# Patient Record
Sex: Female | Born: 1985 | Race: White | Hispanic: No | Marital: Single | Smoking: Current every day smoker
Health system: Southern US, Community
[De-identification: ages and names within clinical notes are randomized; demographics above are authoritative.]

## PROBLEM LIST (undated history)

## (undated) DIAGNOSIS — F4310 Post-traumatic stress disorder, unspecified: Secondary | ICD-10-CM

## (undated) DIAGNOSIS — I10 Essential (primary) hypertension: Secondary | ICD-10-CM

## (undated) HISTORY — PX: OTHER SURGICAL HISTORY: SHX169

---

## 2007-02-11 ENCOUNTER — Emergency Department: Payer: Self-pay | Admitting: Emergency Medicine

## 2007-03-01 ENCOUNTER — Emergency Department: Payer: Self-pay | Admitting: Emergency Medicine

## 2007-05-09 ENCOUNTER — Observation Stay: Payer: Self-pay | Admitting: Obstetrics and Gynecology

## 2008-01-20 ENCOUNTER — Emergency Department: Payer: Self-pay | Admitting: Emergency Medicine

## 2008-04-13 ENCOUNTER — Emergency Department: Payer: Self-pay

## 2008-05-04 ENCOUNTER — Emergency Department: Payer: Self-pay | Admitting: Emergency Medicine

## 2008-06-14 ENCOUNTER — Emergency Department: Payer: Self-pay | Admitting: Emergency Medicine

## 2008-07-22 ENCOUNTER — Emergency Department (HOSPITAL_BASED_OUTPATIENT_CLINIC_OR_DEPARTMENT_OTHER): Admission: EM | Admit: 2008-07-22 | Discharge: 2008-07-22 | Payer: Self-pay | Admitting: Emergency Medicine

## 2008-11-18 ENCOUNTER — Emergency Department: Payer: Self-pay | Admitting: Unknown Physician Specialty

## 2008-12-12 ENCOUNTER — Emergency Department: Payer: Self-pay | Admitting: Unknown Physician Specialty

## 2008-12-15 ENCOUNTER — Emergency Department: Payer: Self-pay | Admitting: Emergency Medicine

## 2009-04-03 ENCOUNTER — Emergency Department: Payer: Self-pay | Admitting: Emergency Medicine

## 2009-04-30 ENCOUNTER — Emergency Department: Payer: Self-pay | Admitting: Emergency Medicine

## 2009-05-07 ENCOUNTER — Emergency Department: Payer: Self-pay | Admitting: Emergency Medicine

## 2009-05-20 ENCOUNTER — Emergency Department: Payer: Self-pay | Admitting: Emergency Medicine

## 2010-04-06 ENCOUNTER — Emergency Department (HOSPITAL_COMMUNITY): Admission: EM | Admit: 2010-04-06 | Discharge: 2010-04-07 | Payer: Self-pay | Admitting: Emergency Medicine

## 2010-04-22 ENCOUNTER — Emergency Department: Payer: Self-pay | Admitting: Emergency Medicine

## 2010-04-28 ENCOUNTER — Emergency Department: Payer: Self-pay | Admitting: Emergency Medicine

## 2010-06-21 ENCOUNTER — Encounter: Payer: Self-pay | Admitting: Maternal & Fetal Medicine

## 2010-07-13 ENCOUNTER — Emergency Department: Payer: Self-pay | Admitting: Emergency Medicine

## 2010-07-24 ENCOUNTER — Encounter: Payer: Self-pay | Admitting: Obstetrics & Gynecology

## 2010-07-25 LAB — WET PREP, GENITAL
Clue Cells Wet Prep HPF POC: NONE SEEN
Trich, Wet Prep: NONE SEEN

## 2010-07-25 LAB — CBC
Platelets: 290 10*3/uL (ref 150–400)
RBC: 4.32 MIL/uL (ref 3.87–5.11)
WBC: 12.7 10*3/uL — ABNORMAL HIGH (ref 4.0–10.5)

## 2010-07-25 LAB — URINALYSIS, ROUTINE W REFLEX MICROSCOPIC
Hgb urine dipstick: NEGATIVE
Nitrite: NEGATIVE
Specific Gravity, Urine: 1.014 (ref 1.005–1.030)
Urobilinogen, UA: 0.2 mg/dL (ref 0.0–1.0)

## 2010-07-25 LAB — DIFFERENTIAL
Eosinophils Absolute: 0.3 10*3/uL (ref 0.0–0.7)
Lymphocytes Relative: 23 % (ref 12–46)
Lymphs Abs: 2.9 10*3/uL (ref 0.7–4.0)
Neutrophils Relative %: 70 % (ref 43–77)

## 2010-07-25 LAB — BASIC METABOLIC PANEL
Calcium: 8.9 mg/dL (ref 8.4–10.5)
Creatinine, Ser: 0.66 mg/dL (ref 0.4–1.2)
GFR calc Af Amer: >60 mL/min (ref >60)

## 2010-07-25 LAB — HCG, QUANTITATIVE, PREGNANCY: hCG, Beta Chain, Quant, S: 57601 m[IU]/mL — ABNORMAL HIGH (ref <5)

## 2010-07-25 LAB — POCT PREGNANCY, URINE: Preg Test, Ur: POSITIVE

## 2010-07-25 LAB — ABO/RH: ABO/RH(D): A NEG

## 2010-09-16 ENCOUNTER — Observation Stay: Payer: Self-pay | Admitting: Obstetrics and Gynecology

## 2010-09-25 ENCOUNTER — Observation Stay: Payer: Self-pay | Admitting: Obstetrics & Gynecology

## 2010-10-18 ENCOUNTER — Inpatient Hospital Stay: Payer: Self-pay | Admitting: Obstetrics and Gynecology

## 2010-11-25 ENCOUNTER — Emergency Department: Payer: Self-pay | Admitting: Emergency Medicine

## 2011-06-30 ENCOUNTER — Emergency Department: Payer: Self-pay | Admitting: *Deleted

## 2011-07-12 ENCOUNTER — Encounter (HOSPITAL_COMMUNITY): Payer: Self-pay

## 2011-07-12 ENCOUNTER — Emergency Department (HOSPITAL_COMMUNITY)
Admission: EM | Admit: 2011-07-12 | Discharge: 2011-07-12 | Disposition: A | Discharge status: Home / Self Care | Payer: Self-pay | Attending: Emergency Medicine | Admitting: Emergency Medicine

## 2011-07-12 ENCOUNTER — Emergency Department (HOSPITAL_COMMUNITY): Payer: Self-pay

## 2011-07-12 DIAGNOSIS — I10 Essential (primary) hypertension: Secondary | ICD-10-CM | POA: Insufficient documentation

## 2011-07-12 DIAGNOSIS — J45909 Unspecified asthma, uncomplicated: Secondary | ICD-10-CM | POA: Insufficient documentation

## 2011-07-12 DIAGNOSIS — M79672 Pain in left foot: Secondary | ICD-10-CM

## 2011-07-12 DIAGNOSIS — M79609 Pain in unspecified limb: Secondary | ICD-10-CM | POA: Insufficient documentation

## 2011-07-12 HISTORY — DX: Essential (primary) hypertension: I10

## 2011-07-12 MED ORDER — TRAMADOL HCL 50 MG PO TABS
50.0000 mg | ORAL_TABLET | Freq: Once | ORAL | Status: AC
  Administered 2011-07-12: 50 mg via ORAL
  Filled 2011-07-12: qty 1

## 2011-07-12 MED ORDER — TRAMADOL HCL 50 MG PO TABS: 50.0000 mg | ORAL_TABLET | Freq: Four times a day (QID) | ORAL | Status: AC | PRN

## 2011-07-12 NOTE — ED Notes (Signed)
Pt reports severe pain and swelling to her left foot.  Pt denies any injury.  nad noted

## 2011-07-12 NOTE — ED Notes (Signed)
Complain of pain and swelling in left foot. Denies injury

## 2011-07-12 NOTE — ED Provider Notes (Signed)
History     CSN: [id number]  Arrival date & time 07/12/11  4098   First MD Initiated Contact with Patient 07/12/11 [phone]      Chief Complaint  Patient presents with  . Foot Pain    (Consider location/radiation/quality/duration/timing/severity/associated sxs/prior treatment) HPI Comments: Pt denies injury.  Concerned that she may have gout or thyroid problem.  Patient is a 26 y.o. female presenting with lower extremity pain. The history is provided by the patient. No language interpreter was used.  Foot Pain This is a new problem. Episode onset: 3 days ago. The problem occurs constantly. The problem has been unchanged. The symptoms are aggravated by walking and standing. She has tried nothing for the symptoms.    Past Medical History  Diagnosis Date  . Hypertension   . Asthma     History reviewed. No pertinent past surgical history.  No family history on file.  History  Substance Use Topics  . Smoking status: Current Everyday Smoker  . Smokeless tobacco: Not on file  . Alcohol Use: No    OB History    Grav Para Term Preterm Abortions TAB SAB Ect Mult Living                  Review of Systems  Musculoskeletal:       Foot pain  All other systems reviewed and are negative.    Allergies  Penicillins  Home Medications   Current Outpatient Rx  Name Route Sig Dispense Refill  . BECLOMETHASONE DIPROPIONATE 80 MCG/ACT IN AERS Inhalation Inhale 1 puff into the lungs as needed. Shortness of Breath    . IBUPROFEN 200 MG PO TABS Oral Take 400 mg by mouth every 6 (six) hours as needed. Pain    . LISINOPRIL 2.5 MG PO TABS Oral Take 2.5 mg by mouth daily.    . ADULT MULTIVITAMIN W/MINERALS CH Oral Take 1 tablet by mouth daily.    . TRAMADOL HCL 50 MG PO TABS Oral Take 1 tablet (50 mg total) by mouth every 6 (six) hours as needed for pain. 15 tablet 0    BP 123/73  Pulse 108  Temp(Src) 97.8 F (36.6 C) (Oral)  Resp 18  Ht 5\' 3"  (1.6 m)  Wt 140 lb (63.504 kg)  BMI  24.80 kg/m2  SpO2 100%  LMP 07/05/2011  Physical Exam  Nursing note and vitals reviewed. Constitutional: She is oriented to person, place, and time. She appears well-developed and well-nourished. No distress.  HENT:  Head: Normocephalic and atraumatic.  Eyes: EOM are normal.  Neck: Normal range of motion.  Cardiovascular: Normal rate, regular rhythm and normal heart sounds.   Pulmonary/Chest: Effort normal and breath sounds normal.  Abdominal: Soft. She exhibits no distension. There is no tenderness.  Musculoskeletal: She exhibits tenderness.       Left foot: She exhibits decreased range of motion, tenderness and bony tenderness. She exhibits no swelling, no crepitus and no deformity.       Feet:  Neurological: She is alert and oriented to person, place, and time.  Skin: Skin is warm and dry.  Psychiatric: She has a normal mood and affect. Judgment normal.    ED Course  Procedures (including critical care time)  Labs Reviewed - No data to display Dg Ankle Complete Left  07/12/2011  *RADIOLOGY REPORT*  Clinical Data: Pain  LEFT ANKLE COMPLETE - 3+ VIEW  Comparison: None.  Findings: There is no evidence of fracture or dislocation.  Ankle mortise is intact.  Mild degenerative changes are present at the talonavicular joint.   No soft tissue abnormality.  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  Original Report Authenticated By: Brandon Melnick, M.D.   Dg Foot Complete Left  07/12/2011  *RADIOLOGY REPORT*  Clinical Data: Pain  LEFT FOOT - COMPLETE 3+ VIEW  Comparison: None.  Findings: There is no evidence of bone, joint, or soft tissue abnormality.  IMPRESSION: Negative left foot.  Original Report Authenticated By: Brandon Melnick, M.D.     1. Pain of left heel       MDM  Ace wrap, crutches, ice and elevation.  F/u with ortho prn.        Worthy Rancher, PA 07/12/11 1041  Worthy Rancher, [place] 07/15/11 [phone]

## 2011-07-12 NOTE — Discharge Instructions (Signed)
Use of Crutches You have been prescribed crutches to take weight off one of your lower extremities (legs/feet). When using crutches, make sure you are not putting pressure on your armpits. This could cause damage to the nerves in the axilla (your armpit area) that extend to your hands and arms. When fitted properly the crutches should be two to three finger breadths below your axilla (armpit). Your weight should be supported by your hands, and not by resting upon the crutches with your armpits. When walking, first step with the crutches, then swing the healthy leg through and slightly ahead. When going up stairs, first step up with the healthy leg and then follow with the crutches and injured leg up to the same step, and so forth. If there is a handrail, hold both crutches in one hand, place your other hand on the handrail, and while placing your weight on your arms, lift your good leg to the step, then bring the crutches and the injured leg up to that step. Repeat for each step. When going down stairs, first step with the injured leg and crutches, following down with the healthy leg to the same step. Be very careful, as going down stairs with crutches is very challenging. If you feel wobbly or nervous, sit down and inch yourself down the stairs on your butt. To get up from a chair, hold injured leg forward, grab armrest with one hand and the top of the crutches with the other hand. Using these supports, pull yourself up to a standing position. Reverse this procedure for sitting. See your caregiver for follow up as suggested. If you are discharged in an ace wrap and develop numbness, tingling, swelling, or increased pain, loosen the ace wrap and re-wrap looser. If these problems persist, see your caregiver as needed. If you have been instructed to use partial weight bearing, bear (apply) the amount of weight as suggested by your caregiver. Do not bear weight in an amount that causes pain on the area of  injury. Document Released: 04/27/2000 Document Revised: 01/10/2011 Document Reviewed: 07/05/2008 [facility] Patient Information 2012 [place], [place].Cryotherapy Cryotherapy means treatment with cold. Ice or gel packs can be used to reduce both pain and swelling. Ice is the most helpful within the first 24 to 48 hours after an injury or flareup from overusing a muscle or joint. Sprains, strains, spasms, burning pain, shooting pain, and aches can all be eased with ice. Ice can also be used when recovering from surgery. Ice is effective, has very few side effects, and is safe for most people to use. PRECAUTIONS  Ice is not a safe treatment option for people with:  Raynaud's phenomenon. This is a condition affecting small blood vessels in the extremities. Exposure to cold may cause your problems to return.   Cold hypersensitivity. There are many forms of cold hypersensitivity, including:   Cold urticaria. Red, itchy hives appear on the skin when the tissues begin to warm after being iced.   Cold erythema. This is a red, itchy rash caused by exposure to cold.   Cold hemoglobinuria. Red blood cells break down when the tissues begin to warm after being iced. The hemoglobin that carry oxygen are passed into the urine because they cannot combine with blood proteins fast enough.   Numbness or altered sensitivity in the area being iced.  If you have any of the following conditions, do not use ice until you have discussed cryotherapy with your caregiver:  Heart conditions, such as arrhythmia, angina,  or chronic heart disease.   High blood pressure.   Healing wounds or open skin in the area being iced.   Current infections.   Rheumatoid arthritis.   Poor circulation.   Diabetes.  Ice slows the blood flow in the region it is applied. This is beneficial when trying to stop inflamed tissues from spreading irritating chemicals to surrounding tissues. However, if you expose your skin to cold  temperatures for too long or without the proper protection, you can damage your skin or nerves. Watch for signs of skin damage due to cold. HOME CARE INSTRUCTIONS Follow these tips to use ice and cold packs safely.  Place a dry or damp towel between the ice and skin. A damp towel will cool the skin more quickly, so you may need to shorten the time that the ice is used.   For a more rapid response, add gentle compression to the ice.   Ice for no more than 10 to 20 minutes at a time. The bonier the area you are icing, the less time it will take to get the benefits of ice.   Check your skin after 5 minutes to make sure there are no signs of a poor response to cold or skin damage.   Rest 20 minutes or more in between uses.   Once your skin is numb, you can end your treatment. You can test numbness by very lightly touching your skin. The touch should be so light that you do not see the skin dimple from the pressure of your fingertip. When using ice, most people will feel these normal sensations in this order: cold, burning, aching, and numbness.   Do not use ice on someone who cannot communicate their responses to pain, such as small children or people with dementia.  HOW TO MAKE AN ICE PACK Ice packs are the most common way to use ice therapy. Other methods include ice massage, ice baths, and cryo-sprays. Muscle creams that cause a cold, tingly feeling do not offer the same benefits that ice offers and should not be used as a substitute unless recommended by your caregiver. To make an ice pack, do one of the following:  Place crushed ice or a bag of frozen vegetables in a sealable plastic bag. Squeeze out the excess air. Place this bag inside another plastic bag. Slide the bag into a pillowcase or place a damp towel between your skin and the bag.   Mix 3 parts water with 1 part rubbing alcohol. Freeze the mixture in a sealable plastic bag. When you remove the mixture from the freezer, it will be  slushy. Squeeze out the excess air. Place this bag inside another plastic bag. Slide the bag into a pillowcase or place a damp towel between your skin and the bag.  SEEK MEDICAL CARE IF:  You develop white spots on your skin. This may give the skin a blotchy (mottled) appearance.   Your skin turns blue or pale.   Your skin becomes waxy or hard.   Your swelling gets worse.  MAKE SURE YOU:   Understand these instructions.   Will watch your condition.   Will get help right away if you are not doing well or get worse.  Document Released: 12/25/2010 Document Reviewed: 12/21/2010 [facility] Patient Information 2012 [place], [place].   Wear the ace wrap for comfort.  Use the crutches as needed and bear weight as tolerated.  Apply ice 10-15 min several times daily and elevate frequently.  Take  ibuprofen up to 800 mg every 9 hrs with food.  Take the tramadol as directed.  Follow up with dr. Hilda Lias if the problems persists.  See you MD regarding your concern for thyroid or other medical problems.

## 2011-07-15 NOTE — ED Provider Notes (Signed)
Medical screening examination/treatment/procedure(s) were performed by non-physician practitioner and as supervising physician I was immediately available for consultation/collaboration.   Benny Lennert, MD 07/15/11 1110

## 2011-09-14 ENCOUNTER — Emergency Department: Payer: Self-pay | Admitting: Emergency Medicine

## 2011-09-14 LAB — CBC
HCT: 44.4 % (ref 35.0–47.0)
MCH: 28.5 pg (ref 26.0–34.0)
MCV: 87 fL (ref 80–100)
Platelet: 493 10*3/uL — ABNORMAL HIGH (ref 150–440)

## 2011-09-14 LAB — COMPREHENSIVE METABOLIC PANEL
Bilirubin,Total: 0.5 mg/dL (ref 0.2–1.0)
Co2: 22 mmol/L (ref 21–32)
Creatinine: 0.79 mg/dL (ref 0.60–1.30)
EGFR (Non-African Amer.): >60
Glucose: 84 mg/dL (ref 65–99)
Osmolality: 281 (ref 275–301)
SGPT (ALT): 52 U/L
Sodium: 139 mmol/L (ref 136–145)

## 2011-09-14 LAB — URINALYSIS, COMPLETE
Bacteria: NONE SEEN
Glucose,UR: NEGATIVE mg/dL (ref 0–75)
Hyaline Cast: 1
Leukocyte Esterase: NEGATIVE
Ph: 5 (ref 4.5–8.0)
Protein: 30
Squamous Epithelial: 5
WBC UR: <1 /HPF (ref 0–5)

## 2011-10-08 ENCOUNTER — Emergency Department (HOSPITAL_COMMUNITY): Payer: Self-pay

## 2011-10-08 ENCOUNTER — Encounter (HOSPITAL_COMMUNITY): Payer: Self-pay | Admitting: Emergency Medicine

## 2011-10-08 ENCOUNTER — Emergency Department (HOSPITAL_COMMUNITY)
Admission: EM | Admit: 2011-10-08 | Discharge: 2011-10-08 | Disposition: A | Discharge status: Home / Self Care | Payer: Self-pay | Attending: Emergency Medicine | Admitting: Emergency Medicine

## 2011-10-08 DIAGNOSIS — K0889 Other specified disorders of teeth and supporting structures: Secondary | ICD-10-CM

## 2011-10-08 DIAGNOSIS — J45909 Unspecified asthma, uncomplicated: Secondary | ICD-10-CM | POA: Insufficient documentation

## 2011-10-08 DIAGNOSIS — K089 Disorder of teeth and supporting structures, unspecified: Secondary | ICD-10-CM | POA: Insufficient documentation

## 2011-10-08 DIAGNOSIS — R0981 Nasal congestion: Secondary | ICD-10-CM

## 2011-10-08 DIAGNOSIS — I10 Essential (primary) hypertension: Secondary | ICD-10-CM | POA: Insufficient documentation

## 2011-10-08 DIAGNOSIS — J3489 Other specified disorders of nose and nasal sinuses: Secondary | ICD-10-CM | POA: Insufficient documentation

## 2011-10-08 MED ORDER — ALBUTEROL SULFATE HFA 108 (90 BASE) MCG/ACT IN AERS
2.0000 | INHALATION_SPRAY | RESPIRATORY_TRACT | Status: DC
  Administered 2011-10-08: 2 via RESPIRATORY_TRACT
  Filled 2011-10-08: qty 6.7

## 2011-10-08 MED ORDER — TRAMADOL HCL 50 MG PO TABS
50.0000 mg | ORAL_TABLET | Freq: Once | ORAL | Status: AC
  Administered 2011-10-08: 50 mg via ORAL
  Filled 2011-10-08: qty 1

## 2011-10-08 MED ORDER — TRAMADOL HCL 50 MG PO TABS: 50.0000 mg | ORAL_TABLET | Freq: Four times a day (QID) | ORAL | Status: AC | PRN

## 2011-10-08 NOTE — ED Provider Notes (Signed)
History     CSN: [id number]  Arrival date & time 10/08/11  1113   First MD Initiated Contact with Patient 10/08/11 1223      Chief Complaint  Patient presents with  . Cough    (Consider location/radiation/quality/duration/timing/severity/associated sxs/prior treatment) Patient is a 26 y.o. female presenting with cough. The history is provided by the patient.  Cough This is a new problem. The problem occurs constantly. The cough is productive of sputum. There has been no fever. Associated symptoms include sore throat, myalgias and wheezing. Pertinent negatives include no chest pain, no chills, no ear pain and no shortness of breath. She has tried nothing for the symptoms.    Past Medical History  Diagnosis Date  . Hypertension   . Asthma     History reviewed. No pertinent past surgical history.  History reviewed. No pertinent family history.  History  Substance Use Topics  . Smoking status: Current Everyday Smoker  . Smokeless tobacco: Not on file  . Alcohol Use: No    OB History    Grav Para Term Preterm Abortions TAB SAB Ect Mult Living                  Review of Systems  Constitutional: Negative for chills and activity change.       All ROS Neg except as noted in HPI  HENT: Positive for sore throat. Negative for ear pain, nosebleeds and neck pain.   Eyes: Negative for photophobia and discharge.  Respiratory: Positive for cough and wheezing. Negative for shortness of breath.   Cardiovascular: Negative for chest pain and palpitations.  Gastrointestinal: Negative for abdominal pain and blood in stool.  Genitourinary: Negative for dysuria, frequency and hematuria.  Musculoskeletal: Positive for myalgias. Negative for back pain and arthralgias.  Skin: Negative.   Neurological: Negative for dizziness, seizures and speech difficulty.  Psychiatric/Behavioral: Negative for hallucinations and confusion.    Allergies  Penicillins  Home Medications   Current  Outpatient Rx  Name Route Sig Dispense Refill  . BECLOMETHASONE DIPROPIONATE 80 MCG/ACT IN AERS Inhalation Inhale 1 puff into the lungs as needed. Shortness of Breath    . IBUPROFEN 200 MG PO TABS Oral Take 400 mg by mouth every 6 (six) hours as needed. Pain    . LISINOPRIL 2.5 MG PO TABS Oral Take 2.5 mg by mouth daily.    . ADULT MULTIVITAMIN W/MINERALS CH Oral Take 1 tablet by mouth daily.    . TRAMADOL HCL 50 MG PO TABS Oral Take 1 tablet (50 mg total) by mouth every 6 (six) hours as needed for pain. 20 tablet 0    BP 157/94  Pulse 102  Temp(Src) 97.2 F (36.2 C) (Oral)  Resp 14  SpO2 99%  LMP 10/06/2011  Physical Exam  Nursing note and vitals reviewed. Constitutional: She is oriented to person, place, and time. She appears well-developed and well-nourished.  Non-toxic appearance.  HENT:  Head: Normocephalic.  Right Ear: Tympanic membrane and external ear normal.  Left Ear: Tympanic membrane and external ear normal.       Nasal congestion. Right upper molar cavity.  Eyes: EOM and lids are normal. Pupils are equal, round, and reactive to light.  Neck: Normal range of motion. Neck supple. Carotid bruit is not present.  Cardiovascular: Normal rate, regular rhythm, normal heart sounds, intact distal pulses and normal pulses.   Pulmonary/Chest: Breath sounds normal. No respiratory distress.       Course breath sound present. No focal consolidation.  Abdominal: Soft. Bowel sounds are normal. There is no tenderness. There is no guarding.  Musculoskeletal: Normal range of motion.  Lymphadenopathy:       Head (right side): No submandibular adenopathy present.       Head (left side): No submandibular adenopathy present.    She has no cervical adenopathy.  Neurological: She is alert and oriented to person, place, and time. She has normal strength. No cranial nerve deficit or sensory deficit.  Skin: Skin is warm and dry.  Psychiatric: She has a normal mood and affect. Her speech is  normal.    ED Course  Procedures (including critical care time)  Labs Reviewed - No data to display Dg Chest 2 View  10/08/2011  *RADIOLOGY REPORT*  Clinical Data: Cough.  Congestion.  Tobacco use.  CHEST - 2 VIEW  Comparison: None.  Findings: Cardiac and mediastinal contours appear normal.  The lungs appear clear.  No pleural effusion is identified.  IMPRESSION:  No significant abnormality identified.  Original Report Authenticated By: Dellia Cloud, M.D.     1. Nasal congestion   2. Toothache       MDM  I have reviewed nursing notes, vital signs, and all appropriate lab and imaging results for this patient. Chest xray is negative. Pulse ox 99%, wnl.  Albuterol inhaler given. Rx for tramadol given to the  patient     Kathie Dike, [place] 10/08/11 2203

## 2011-10-08 NOTE — ED Notes (Addendum)
Pt states has been coughing x 1.5weeks. Sinus/sneezing/runny nose. Low grade fever per pt. States thinks it is due to mold in her house. Has not seen pcp and had old rx for erythromycin that she got filled and has been taking for 3 days. Nad. Sore in mouth x 2 days ago.

## 2011-10-08 NOTE — ED Notes (Signed)
Pt states has had respiratory infection x 10 days. Pt is taking an old (uncompleted antibiotic from Dec 2012) x 3 days. Pt denies change in URI symptoms of sore throat, earache, rhinitis,cough and low grade fever since restarting the partial RX of erythromycin. Pt is convinced after looking up symptoms on-line that she is having an allergic reaction to mold in her house. Pt reports living in this house for a long time. Pt also c/o lower rt dental pain.

## 2011-10-08 NOTE — Discharge Instructions (Signed)
Dental Pain A tooth ache may be caused by cavities (tooth decay). Cavities expose the nerve of the tooth to air and hot or cold temperatures. It may come from an infection or abscess (also called a boil or furuncle) around your tooth. It is also often caused by dental caries (tooth decay). This causes the pain you are having. DIAGNOSIS  Your caregiver can diagnose this problem by exam. TREATMENT   If caused by an infection, it may be treated with medications which kill germs (antibiotics) and pain medications as prescribed by your caregiver. Take medications as directed.   Only take over-the-counter or prescription medicines for pain, discomfort, or fever as directed by your caregiver.   Whether the tooth ache today is caused by infection or dental disease, you should see your dentist as soon as possible for further care.  SEEK MEDICAL CARE IF: The exam and treatment you received today has been provided on an emergency basis only. This is not a substitute for complete medical or dental care. If your problem worsens or new problems (symptoms) appear, and you are unable to meet with your dentist, call or return to this location. SEEK IMMEDIATE MEDICAL CARE IF:   You have a fever.   You develop redness and swelling of your face, jaw, or neck.   You are unable to open your mouth.   You have severe pain uncontrolled by pain medicine.  MAKE SURE YOU:   Understand these instructions.   Will watch your condition.   Will get help right away if you are not doing well or get worse.  Document Released: 04/30/2005 Document Revised: 04/19/2011 Document Reviewed: 12/17/2007 [facility] Patient Information 2012 [place], [place].Saline Nose Drops  To help clear a stuffy nose, put salt water (saline) nose drops in your infant's nose. This helps to loosen the secretions in the nose. Use a bulb syringe to clean the nose out:  Before feeding.   Before putting your infant down for naps.   No more than  once every 3 hours to avoid irritating your infant's nostrils.  HOME CARE  Buy nose drops at your local drug store. You can also make nose drops yourself. Mix 1 cup of water with  teaspoon of salt. Stir. Store this mixture at room temperature. Make a new batch daily.   To use the drops:   Put 1 or 2 drops in each side of infant's nose with a clean medicine dropper. Do not use this dropper for any other medicine.   Squeeze the air out of the suction bulb before inserting it into your infant's nose.   While still squeezing the bulb flat, place the tip of the bulb into a nostril. Let air come back into the bulb. The suction will pull snot out of the nose and into the bulb.   Repeat on other nostril.   Squeeze the bulb several times into a tissue and wash the bulb tip in soapy water. Store the bulb with the tip side down on paper towel.   Use the bulb syringe with only the saline drops to avoid irritating your infant's nostrils.  GET HELP RIGHT AWAY IF:  The snot changes to green or yellow.   The snot gets thicker.   Your infant is 3 months or younger with a rectal temperature of 100.4 F (38 C) or higher.   Your infant is older than 3 months with a rectal temperature of 102 F (38.9 C) or higher.   The stuffy nose lasts  10 days or longer.   There is trouble breathing or feeding.  MAKE SURE YOU:  Understand these instructions.   Will watch your infant's condition.   Will get help right away if your infant is not doing well or gets worse.  Document Released: 02/25/2009 Document Revised: 04/19/2011 Document Reviewed: 02/25/2009 [facility] Patient Information 2012 [place], [place].

## 2011-10-09 NOTE — ED Provider Notes (Signed)
Medical screening examination/treatment/procedure(s) were performed by non-physician practitioner and as supervising physician I was immediately available for consultation/collaboration.   Glynn Octave, MD 10/09/11 [phone]

## 2011-10-21 ENCOUNTER — Emergency Department: Payer: Self-pay | Admitting: Emergency Medicine

## 2011-10-30 ENCOUNTER — Encounter (HOSPITAL_COMMUNITY): Payer: Self-pay | Admitting: Emergency Medicine

## 2011-10-30 ENCOUNTER — Emergency Department (HOSPITAL_COMMUNITY)
Admission: EM | Admit: 2011-10-30 | Discharge: 2011-10-30 | Disposition: A | Discharge status: Home / Self Care | Payer: Self-pay | Attending: Emergency Medicine | Admitting: Emergency Medicine

## 2011-10-30 DIAGNOSIS — M79673 Pain in unspecified foot: Secondary | ICD-10-CM

## 2011-10-30 DIAGNOSIS — I10 Essential (primary) hypertension: Secondary | ICD-10-CM | POA: Insufficient documentation

## 2011-10-30 DIAGNOSIS — F17200 Nicotine dependence, unspecified, uncomplicated: Secondary | ICD-10-CM | POA: Insufficient documentation

## 2011-10-30 DIAGNOSIS — M79609 Pain in unspecified limb: Secondary | ICD-10-CM | POA: Insufficient documentation

## 2011-10-30 MED ORDER — TRAMADOL HCL 50 MG PO TABS: 50.0000 mg | ORAL_TABLET | Freq: Four times a day (QID) | ORAL | Status: DC | PRN

## 2011-10-30 NOTE — ED Provider Notes (Signed)
Medical screening examination/treatment/procedure(s) were performed by non-physician practitioner and as supervising physician I was immediately available for consultation/collaboration.  Shelda Jakes, MD 10/30/11 1958

## 2011-10-30 NOTE — ED Provider Notes (Signed)
History     CSN: [id number]  Arrival date & time 10/30/11  1842   First MD Initiated Contact with Patient 10/30/11 1923      Chief Complaint  Patient presents with  . Foot Pain    (Consider location/radiation/quality/duration/timing/severity/associated sxs/prior treatment) HPI Comments: Patient c/o persistent pain to her right foot for several days.  States that she fell in a swimming pool one week ago and was seen at another ED and diagnosed with a fracture to her foot.  She is unsure of which bone.  States that she has been walking and standing more on her foot this week and the pain has increased.  She denies swelling or numbness.  PAin improves with rest.    Patient is a 26 y.o. female presenting with lower extremity pain. The history is provided by the patient.  Foot Pain Associated symptoms include arthralgias and joint swelling. Pertinent negatives include no chills or fever.    Past Medical History  Diagnosis Date  . Hypertension   . Asthma     History reviewed. No pertinent past surgical history.  History reviewed. No pertinent family history.  History  Substance Use Topics  . Smoking status: Current Everyday Smoker  . Smokeless tobacco: Not on file  . Alcohol Use: Yes     ocassionally    OB History    Grav Para Term Preterm Abortions TAB SAB Ect Mult Living                  Review of Systems  Constitutional: Negative for fever and chills.  Genitourinary: Negative for dysuria and difficulty urinating.  Musculoskeletal: Positive for joint swelling and arthralgias. Negative for back pain and gait problem.  Skin: Negative for color change and wound.  All other systems reviewed and are negative.    Allergies  Penicillins  Home Medications   Current Outpatient Rx  Name Route Sig Dispense Refill  . BECLOMETHASONE DIPROPIONATE 80 MCG/ACT IN AERS Inhalation Inhale 1 puff into the lungs as needed. Shortness of Breath    . IBUPROFEN 200 MG PO TABS Oral  Take 400 mg by mouth every 6 (six) hours as needed. Pain    . LISINOPRIL 2.5 MG PO TABS Oral Take 2.5 mg by mouth daily.    . ADULT MULTIVITAMIN W/MINERALS CH Oral Take 1 tablet by mouth daily.    . TRAMADOL HCL 50 MG PO TABS Oral Take 1 tablet (50 mg total) by mouth every 6 (six) hours as needed for pain. 20 tablet 0    BP 159/94  Pulse 89  Temp 97.9 F (36.6 C) (Oral)  Resp 20  Ht 5\' 3"  (1.6 m)  Wt 150 lb (68.04 kg)  BMI 26.57 kg/m2  SpO2 100%  LMP 10/23/2011  Physical Exam  Nursing note and vitals reviewed. Constitutional: She is oriented to person, place, and time. She appears well-developed and well-nourished. No distress.  HENT:  Head: Normocephalic and atraumatic.  Cardiovascular: Normal rate, regular rhythm, normal heart sounds and intact distal pulses.   Pulmonary/Chest: Effort normal and breath sounds normal.  Musculoskeletal: She exhibits tenderness.       Right foot: She exhibits tenderness and bony tenderness. She exhibits normal range of motion, no swelling, normal capillary refill, no crepitus, no deformity and no laceration.       Feet:       ttp of the dorsal right foot. ROM is preserved.  DP pulse is brisk, sensation intact.  No erythema, abrasion, bruising or  deformity.    Neurological: She is alert and oriented to person, place, and time. She exhibits normal muscle tone. Coordination normal.  Skin: Skin is warm and dry.    ED Course  Procedures (including critical care time)  Labs Reviewed - No data to display   1. Foot pain       MDM     Pt is wearing an ACE wrap and post op shoe.  Has crutches.  NV intact.    Patient advised to follow-up with orthopedics.  Referral given  Patient / Family / Caregiver understand and agree with initial ED impression and plan with expectations set for ED visit. Pt stable in ED with no significant deterioration in condition. Pt feels improved after observation and/or treatment in ED.    Prescribed: Tramadol  #20   [place] Kirchner [address]. [place], [place] 10/30/11 1956

## 2011-10-30 NOTE — ED Notes (Signed)
Patient states "I broke my right foot over 1 week ago and I stayed off of it until this past weekend when I did a lot of walking and now my foot is hurting really bad."

## 2011-11-09 ENCOUNTER — Emergency Department (HOSPITAL_COMMUNITY)
Admission: EM | Admit: 2011-11-09 | Discharge: 2011-11-09 | Disposition: A | Discharge status: Home / Self Care | Payer: Self-pay | Attending: Emergency Medicine | Admitting: Emergency Medicine

## 2011-11-09 ENCOUNTER — Encounter (HOSPITAL_COMMUNITY): Payer: Self-pay | Admitting: *Deleted

## 2011-11-09 DIAGNOSIS — Z79899 Other long term (current) drug therapy: Secondary | ICD-10-CM | POA: Insufficient documentation

## 2011-11-09 DIAGNOSIS — F17200 Nicotine dependence, unspecified, uncomplicated: Secondary | ICD-10-CM | POA: Insufficient documentation

## 2011-11-09 DIAGNOSIS — K089 Disorder of teeth and supporting structures, unspecified: Secondary | ICD-10-CM | POA: Insufficient documentation

## 2011-11-09 DIAGNOSIS — K0889 Other specified disorders of teeth and supporting structures: Secondary | ICD-10-CM

## 2011-11-09 DIAGNOSIS — I10 Essential (primary) hypertension: Secondary | ICD-10-CM | POA: Insufficient documentation

## 2011-11-09 MED ORDER — CLINDAMYCIN HCL 150 MG PO CAPS: 300.0000 mg | ORAL_CAPSULE | Freq: Three times a day (TID) | ORAL | Status: AC

## 2011-11-09 MED ORDER — TRAMADOL HCL 50 MG PO TABS: 50.0000 mg | ORAL_TABLET | Freq: Four times a day (QID) | ORAL | Status: AC | PRN

## 2011-11-09 NOTE — Discharge Instructions (Signed)
Dental Pain  A tooth ache may be caused by cavities (tooth decay). Cavities expose the nerve of the tooth to air and hot or cold temperatures. It may come from an infection or abscess (also called a boil or furuncle) around your tooth. It is also often caused by dental caries (tooth decay). This causes the pain you are having.  DIAGNOSIS   Your caregiver can diagnose this problem by exam.  TREATMENT   · If caused by an infection, it may be treated with medications which kill germs (antibiotics) and pain medications as prescribed by your caregiver. Take medications as directed.  · Only take over-the-counter or prescription medicines for pain, discomfort, or fever as directed by your caregiver.  · Whether the tooth ache today is caused by infection or dental disease, you should see your dentist as soon as possible for further care.  SEEK MEDICAL CARE IF:  The exam and treatment you received today has been provided on an emergency basis only. This is not a substitute for complete medical or dental care. If your problem worsens or new problems (symptoms) appear, and you are unable to meet with your dentist, call or return to this location.  SEEK IMMEDIATE MEDICAL CARE IF:   · You have a fever.  · You develop redness and swelling of your face, jaw, or neck.  · You are unable to open your mouth.  · You have severe pain uncontrolled by pain medicine.  MAKE SURE YOU:   · Understand these instructions.  · Will watch your condition.  · Will get help right away if you are not doing well or get worse.  Document Released: 04/30/2005 Document Revised: 04/19/2011 Document Reviewed: 12/17/2007  [employer]® Patient Information ©2012 [employer].

## 2011-11-09 NOTE — ED Provider Notes (Signed)
History     CSN: [id number]  Arrival date & time 11/09/11  1826   First MD Initiated Contact with Patient 11/09/11 1843      Chief Complaint  Patient presents with  . Dental Pain    (Consider location/radiation/quality/duration/timing/severity/associated sxs/prior treatment) HPI Comments: Tooth broke while eating a carrot 5 days ago.  It started hurting this AM.  Patient is a 26 y.o. female presenting with tooth pain. The history is provided by the patient. No language interpreter was used.  Dental PainThe primary symptoms include mouth pain. Primary symptoms do not include fever. The symptoms began 6 to 12 hours ago. The symptoms are unchanged.    Past Medical History  Diagnosis Date  . Hypertension   . Asthma     History reviewed. No pertinent past surgical history.  History reviewed. No pertinent family history.  History  Substance Use Topics  . Smoking status: Current Everyday Smoker -- 0.5 packs/day  . Smokeless tobacco: Not on file  . Alcohol Use: Yes     ocassionally    OB History    Grav Para Term Preterm Abortions TAB SAB Ect Mult Living                  Review of Systems  Constitutional: Negative for fever and chills.  HENT: Positive for dental problem.   All other systems reviewed and are negative.    Allergies  Penicillins  Home Medications   Current Outpatient Rx  Name Route Sig Dispense Refill  . BECLOMETHASONE DIPROPIONATE 80 MCG/ACT IN AERS Inhalation Inhale 1 puff into the lungs as needed. Shortness of Breath    . IBUPROFEN 200 MG PO TABS Oral Take 400 mg by mouth every 6 (six) hours as needed. Pain    . LISINOPRIL 2.5 MG PO TABS Oral Take 2.5 mg by mouth daily.    . ADULT MULTIVITAMIN W/MINERALS CH Oral Take 1 tablet by mouth daily.    Marland Kitchen CLINDAMYCIN HCL 150 MG PO CAPS Oral Take 2 capsules (300 mg total) by mouth 3 (three) times daily. 60 capsule 0  . TRAMADOL HCL 50 MG PO TABS Oral Take 1 tablet (50 mg total) by mouth every 6 (six)  hours as needed for pain. 30 tablet 0    BP 150/97  Pulse 91  Temp 99.7 F (37.6 C) (Oral)  Resp 15  Ht 5\' 3"  (1.6 m)  Wt 150 lb (68.04 kg)  BMI 26.57 kg/m2  SpO2 98%  LMP 10/23/2011  Physical Exam  Nursing note and vitals reviewed. Constitutional: She is oriented to person, place, and time. She appears well-developed and well-nourished. No distress.  HENT:  Head: Normocephalic and atraumatic.  Mouth/Throat:    Eyes: EOM are normal.  Neck: Normal range of motion.  Cardiovascular: Normal rate, regular rhythm and normal heart sounds.   Pulmonary/Chest: Effort normal and breath sounds normal.  Abdominal: Soft. She exhibits no distension. There is no tenderness.  Musculoskeletal: Normal range of motion.  Neurological: She is alert and oriented to person, place, and time.  Skin: Skin is warm and dry.  Psychiatric: She has a normal mood and affect. Judgment normal.    ED Course  Procedures (including critical care time)  Labs Reviewed - No data to display No results found.   1. Pain, dental       MDM  Clindamycin 300 mg, TID, 30 rx ultram, 30 OTC ibuprofen 800 mg TID        Evalina Field, PA 11/10/11  0024  Evalina Field, PA 11/10/11 0025

## 2011-11-09 NOTE — ED Notes (Signed)
Patient broke left lateral lower tooth Monday evening.  Has called her dentist and has an appt. On July 10.  This AM while eating granola, tooth began to hurt much worse. As day progressed, pain worsened. Called her dentist who told her to come to ED.

## 2011-11-09 NOTE — ED Notes (Signed)
Patient with no complaints at this time. Respirations even and unlabored. Skin warm/dry. Discharge instructions reviewed with patient at this time. Patient given opportunity to voice concerns/ask questions. Patient discharged at this time and left Emergency Department with steady gait.   

## 2011-11-10 NOTE — ED Provider Notes (Signed)
Medical screening examination/treatment/procedure(s) were performed by non-physician practitioner and as supervising physician I was immediately available for consultation/collaboration.   Benny Lennert, MD 11/10/11 1452

## 2011-12-02 ENCOUNTER — Emergency Department (HOSPITAL_COMMUNITY): Payer: Self-pay

## 2011-12-02 ENCOUNTER — Emergency Department (HOSPITAL_COMMUNITY)
Admission: EM | Admit: 2011-12-02 | Discharge: 2011-12-02 | Disposition: A | Discharge status: Home / Self Care | Payer: Self-pay | Attending: Emergency Medicine | Admitting: Emergency Medicine

## 2011-12-02 ENCOUNTER — Encounter (HOSPITAL_COMMUNITY): Payer: Self-pay | Admitting: Emergency Medicine

## 2011-12-02 DIAGNOSIS — F17200 Nicotine dependence, unspecified, uncomplicated: Secondary | ICD-10-CM | POA: Insufficient documentation

## 2011-12-02 DIAGNOSIS — X58XXXA Exposure to other specified factors, initial encounter: Secondary | ICD-10-CM | POA: Insufficient documentation

## 2011-12-02 DIAGNOSIS — S93601A Unspecified sprain of right foot, initial encounter: Secondary | ICD-10-CM

## 2011-12-02 DIAGNOSIS — I10 Essential (primary) hypertension: Secondary | ICD-10-CM | POA: Insufficient documentation

## 2011-12-02 DIAGNOSIS — S93609A Unspecified sprain of unspecified foot, initial encounter: Secondary | ICD-10-CM | POA: Insufficient documentation

## 2011-12-02 MED ORDER — TRAMADOL HCL 50 MG PO TABS: 50.0000 mg | ORAL_TABLET | Freq: Four times a day (QID) | ORAL | Status: AC | PRN

## 2011-12-02 MED ORDER — IBUPROFEN 800 MG PO TABS: 800.0000 mg | ORAL_TABLET | Freq: Three times a day (TID) | ORAL | Status: AC

## 2011-12-02 NOTE — ED Notes (Signed)
Pt broke R foot x 2weeks ago. Has not followed up with ortho due to finances. Pt states still hurts and pains going up anterior leg and around ankle. Nad. No swelling noted.

## 2011-12-07 NOTE — ED Provider Notes (Signed)
History     CSN: [id number]  Arrival date & time 12/02/11  1205   First MD Initiated Contact with Patient 12/02/11 1214      Chief Complaint  Patient presents with  . Foot Pain    (Consider location/radiation/quality/duration/timing/severity/associated sxs/prior treatment) HPI Comments: Laurie Stephens presents with persistent pain in her right foot.  She was seen at an outside hospital and diagnosed with a foot fracture.  She was placed in a splint and advised to use crutches, but presents with neither of these devices today,  Stating they were not helpful.  She has not followed up with orthopedics yet,  But does report inverting her ankle yesterday,  Causing increased pain and swelling along the anterior top of her foot.  Her pain is constant and radiates to her distal anterior leg.  Pain is aching,  Constant and worse with weight bearing.  The history is provided by the patient.    Past Medical History  Diagnosis Date  . Hypertension   . Asthma     History reviewed. No pertinent past surgical history.  History reviewed. No pertinent family history.  History  Substance Use Topics  . Smoking status: Current Everyday Smoker -- 0.5 packs/day  . Smokeless tobacco: Not on file  . Alcohol Use: Yes     ocassionally    OB History    Grav Para Term Preterm Abortions TAB SAB Ect Mult Living                  Review of Systems  Musculoskeletal: Positive for joint swelling and arthralgias.  Skin: Negative for wound.  Neurological: Negative for weakness and numbness.    Allergies  Penicillins  Home Medications   Current Outpatient Rx  Name Route Sig Dispense Refill  . BECLOMETHASONE DIPROPIONATE 80 MCG/ACT IN AERS Inhalation Inhale 1 puff into the lungs as needed. Shortness of Breath    . IBUPROFEN 200 MG PO TABS Oral Take 400 mg by mouth every 6 (six) hours as needed. Pain    . IBUPROFEN 800 MG PO TABS Oral Take 1 tablet (800 mg total) by mouth 3 (three) times daily.  21 tablet 0  . LISINOPRIL 2.5 MG PO TABS Oral Take 2.5 mg by mouth daily.    . ADULT MULTIVITAMIN W/MINERALS CH Oral Take 1 tablet by mouth daily.    . TRAMADOL HCL 50 MG PO TABS Oral Take 1 tablet (50 mg total) by mouth every 6 (six) hours as needed for pain. 15 tablet 0    BP 137/77  Pulse 110  Temp 98.7 F (37.1 C) (Oral)  Resp 17  SpO2 100%  LMP 11/25/2011  Physical Exam  Nursing note and vitals reviewed. Constitutional: She appears well-developed and well-nourished.  HENT:  Head: Normocephalic.  Cardiovascular: Normal rate and intact distal pulses.  Exam reveals no decreased pulses.   Pulses:      Dorsalis pedis pulses are 2+ on the right side, and 2+ on the left side.       Posterior tibial pulses are 2+ on the right side, and 2+ on the left side.  Musculoskeletal: She exhibits tenderness.       Right ankle: She exhibits decreased range of motion. She exhibits no swelling, no ecchymosis and normal pulse. tenderness. Lateral malleolus tenderness found. No head of 5th metatarsal and no proximal fibula tenderness found. Achilles tendon normal.  Neurological: She is alert. No sensory deficit.  Skin: Skin is warm, dry and intact.  ED Course  Procedures (including critical care time)  Labs Reviewed - No data to display No results found.   1. Sprain of right foot    aso supplied.  Encouraged to continue using crutches   MDM  RICE, ibprofen,  Tramadol prescribed.   Encouraged f/u with pcp for recheck if not improved after 1 week.        Burgess Amor, PA 12/07/11 1357

## 2011-12-07 NOTE — ED Provider Notes (Signed)
Medical screening examination/treatment/procedure(s) were performed by non-physician practitioner and as supervising physician I was immediately available for consultation/collaboration.   Laray Anger, DO 12/07/11 1725

## 2011-12-22 ENCOUNTER — Encounter (HOSPITAL_COMMUNITY): Payer: Self-pay | Admitting: *Deleted

## 2011-12-22 ENCOUNTER — Emergency Department (HOSPITAL_COMMUNITY)
Admission: EM | Admit: 2011-12-22 | Discharge: 2011-12-22 | Disposition: A | Discharge status: Home / Self Care | Payer: Self-pay | Attending: Emergency Medicine | Admitting: Emergency Medicine

## 2011-12-22 DIAGNOSIS — Z880 Allergy status to penicillin: Secondary | ICD-10-CM | POA: Insufficient documentation

## 2011-12-22 DIAGNOSIS — I10 Essential (primary) hypertension: Secondary | ICD-10-CM | POA: Insufficient documentation

## 2011-12-22 DIAGNOSIS — X58XXXA Exposure to other specified factors, initial encounter: Secondary | ICD-10-CM | POA: Insufficient documentation

## 2011-12-22 DIAGNOSIS — F17200 Nicotine dependence, unspecified, uncomplicated: Secondary | ICD-10-CM | POA: Insufficient documentation

## 2011-12-22 DIAGNOSIS — J45909 Unspecified asthma, uncomplicated: Secondary | ICD-10-CM | POA: Insufficient documentation

## 2011-12-22 DIAGNOSIS — S025XXA Fracture of tooth (traumatic), initial encounter for closed fracture: Secondary | ICD-10-CM | POA: Insufficient documentation

## 2011-12-22 DIAGNOSIS — K0889 Other specified disorders of teeth and supporting structures: Secondary | ICD-10-CM

## 2011-12-22 MED ORDER — CLINDAMYCIN HCL 150 MG PO CAPS: 300.0000 mg | ORAL_CAPSULE | Freq: Three times a day (TID) | ORAL | Status: AC

## 2011-12-22 MED ORDER — CLINDAMYCIN HCL 150 MG PO CAPS
300.0000 mg | ORAL_CAPSULE | Freq: Once | ORAL | Status: AC
  Administered 2011-12-22: 300 mg via ORAL
  Filled 2011-12-22: qty 2

## 2011-12-22 MED ORDER — TRAMADOL HCL 50 MG PO TABS: 50.0000 mg | ORAL_TABLET | Freq: Four times a day (QID) | ORAL | Status: AC | PRN

## 2011-12-22 NOTE — ED Notes (Signed)
C.o toothache to right lower side since Thursday,

## 2011-12-22 NOTE — ED Provider Notes (Signed)
Medical screening examination/treatment/procedure(s) were performed by non-physician practitioner and as supervising physician I was immediately available for consultation/collaboration.  Jonthan Leite, MD 12/22/11 1520 

## 2011-12-22 NOTE — ED Notes (Signed)
Pt c/o left sided dental pain. Pt states she had a dentist apt scheduled for yesterday but was unable to make it and had to reschedule for the 22nd. Pt states the pain is too bad to wait that long.

## 2011-12-22 NOTE — ED Provider Notes (Signed)
History     CSN: [id number]  Arrival date & time 12/22/11  1255   First MD Initiated Contact with Patient 12/22/11 1331      Chief Complaint  Patient presents with  . Dental Pain    (Consider location/radiation/quality/duration/timing/severity/associated sxs/prior treatment) HPI Comments: Broke tooth "a while back" but tooth just began hurting recentl.  Has dental appt on 01-03-12.  Patient is a 26 y.o. female presenting with tooth pain. The history is provided by the patient. No language interpreter was used.  Dental PainThe primary symptoms include mouth pain. Primary symptoms do not include dental injury or fever.    Past Medical History  Diagnosis Date  . Hypertension   . Asthma     History reviewed. No pertinent past surgical history.  No family history on file.  History  Substance Use Topics  . Smoking status: Current Everyday Smoker -- 0.5 packs/day  . Smokeless tobacco: Not on file  . Alcohol Use: Yes     ocassionally    OB History    Grav Para Term Preterm Abortions TAB SAB Ect Mult Living                  Review of Systems  Constitutional: Negative for fever and chills.  HENT: Positive for dental problem.   All other systems reviewed and are negative.    Allergies  Penicillins  Home Medications   Current Outpatient Rx  Name Route Sig Dispense Refill  . ALBUTEROL SULFATE HFA 108 (90 BASE) MCG/ACT IN AERS Inhalation Inhale 2 puffs into the lungs every 6 (six) hours as needed. For shortness of breath    . IBUPROFEN 200 MG PO TABS Oral Take 400 mg by mouth every 6 (six) hours as needed. Pain    . LISINOPRIL 2.5 MG PO TABS Oral Take 2.5 mg by mouth daily.    Marland Kitchen CLINDAMYCIN HCL 150 MG PO CAPS Oral Take 2 capsules (300 mg total) by mouth 3 (three) times daily. 60 capsule 0  . TRAMADOL HCL 50 MG PO TABS Oral Take 1 tablet (50 mg total) by mouth every 6 (six) hours as needed for pain. 30 tablet 0    BP 167/107  Pulse 82  Temp 98.4 F (36.9 C) (Oral)   Resp 18  Ht 5\' 3"  (1.6 m)  Wt 160 lb (72.576 kg)  BMI 28.34 kg/m2  SpO2 100%  LMP 11/25/2011  Physical Exam  Nursing note and vitals reviewed. Constitutional: She is oriented to person, place, and time. She appears well-developed and well-nourished. No distress.  HENT:  Head: Normocephalic and atraumatic.  Mouth/Throat: Uvula is midline, oropharynx is clear and moist and mucous membranes are normal. No dental abscesses, uvula swelling or dental caries.    Eyes: EOM are normal.  Neck: Normal range of motion.  Cardiovascular: Normal rate, regular rhythm and normal heart sounds.   Pulmonary/Chest: Effort normal and breath sounds normal.  Abdominal: Soft. She exhibits no distension. There is no tenderness.  Musculoskeletal: Normal range of motion.  Neurological: She is alert and oriented to person, place, and time.  Skin: Skin is warm and dry.  Psychiatric: She has a normal mood and affect. Judgment normal.    ED Course  Procedures (including critical care time)  Labs Reviewed - No data to display No results found.   1. Pain, dental       MDM  rx-ultram, 30 OTC ibuprofen\ rx-clindamycin, 60 F/u with your dentist        Gerlene Burdock  [place], [place] 12/22/11 1421

## 2012-01-06 ENCOUNTER — Emergency Department: Payer: Self-pay | Admitting: Internal Medicine

## 2012-01-24 ENCOUNTER — Emergency Department (HOSPITAL_COMMUNITY)
Admission: EM | Admit: 2012-01-24 | Discharge: 2012-01-24 | Disposition: A | Discharge status: Home / Self Care | Payer: Self-pay | Attending: Emergency Medicine | Admitting: Emergency Medicine

## 2012-01-24 ENCOUNTER — Encounter (HOSPITAL_COMMUNITY): Payer: Self-pay | Admitting: *Deleted

## 2012-01-24 DIAGNOSIS — J45909 Unspecified asthma, uncomplicated: Secondary | ICD-10-CM | POA: Insufficient documentation

## 2012-01-24 DIAGNOSIS — I10 Essential (primary) hypertension: Secondary | ICD-10-CM | POA: Insufficient documentation

## 2012-01-24 DIAGNOSIS — K047 Periapical abscess without sinus: Secondary | ICD-10-CM | POA: Insufficient documentation

## 2012-01-24 DIAGNOSIS — F17200 Nicotine dependence, unspecified, uncomplicated: Secondary | ICD-10-CM | POA: Insufficient documentation

## 2012-01-24 DIAGNOSIS — K029 Dental caries, unspecified: Secondary | ICD-10-CM | POA: Insufficient documentation

## 2012-01-24 MED ORDER — CLINDAMYCIN HCL 150 MG PO CAPS
300.0000 mg | ORAL_CAPSULE | Freq: Once | ORAL | Status: AC
  Administered 2012-01-24: 300 mg via ORAL
  Filled 2012-01-24: qty 2

## 2012-01-24 MED ORDER — TRAMADOL HCL 50 MG PO TABS: 50.0000 mg | ORAL_TABLET | Freq: Four times a day (QID) | ORAL | Status: AC | PRN

## 2012-01-24 MED ORDER — CLINDAMYCIN HCL 150 MG PO CAPS: 300.0000 mg | ORAL_CAPSULE | Freq: Three times a day (TID) | ORAL | Status: AC

## 2012-01-24 NOTE — ED Notes (Signed)
Pt states has had dental pain "for a while, missed dental appointment last month" . Awoke this morning with swelling and pain.  Lower left molar is broken and has small visible abscess at gum line.

## 2012-01-24 NOTE — ED Provider Notes (Signed)
History     CSN: [id number]  Arrival date & time 01/24/12  1040   First MD Initiated Contact with Patient 01/24/12 1110      Chief Complaint  Patient presents with  . Dental Pain    (Consider location/radiation/quality/duration/timing/severity/associated sxs/prior treatment) HPI Comments: Micah Flesher to her dentist a few days ago "but my medicaid had run out.  Re-scheduled for 02-07-12.  The L jaw has since become swollen.   Patient is a 26 y.o. female presenting with tooth pain. The history is provided by the patient. No language interpreter was used.  Dental PainThe primary symptoms include mouth pain. Primary symptoms do not include dental injury or fever. Episode onset: several days ago. The symptoms are worsening. The symptoms are recurrent.  Additional symptoms include: jaw pain and facial swelling. Additional symptoms do not include: purulent gums, trouble swallowing, drooling, ear pain and swollen glands.    Past Medical History  Diagnosis Date  . Hypertension   . Asthma     History reviewed. No pertinent past surgical history.  No family history on file.  History  Substance Use Topics  . Smoking status: Current Every Day Smoker -- 0.5 packs/day  . Smokeless tobacco: Not on file  . Alcohol Use: Yes     ocassionally    OB History    Grav Para Term Preterm Abortions TAB SAB Ect Mult Living                  Review of Systems  Constitutional: Negative for fever.  HENT: Positive for facial swelling and dental problem. Negative for ear pain, drooling and trouble swallowing.   Cardiovascular: Negative for chest pain.  All other systems reviewed and are negative.    Allergies  Penicillins  Home Medications   Current Outpatient Rx  Name Route Sig Dispense Refill  . ALBUTEROL SULFATE HFA 108 (90 BASE) MCG/ACT IN AERS Inhalation Inhale 2 puffs into the lungs every 6 (six) hours as needed. For shortness of breath    . CLINDAMYCIN HCL 150 MG PO CAPS Oral Take 2  capsules (300 mg total) by mouth 3 (three) times daily. 60 capsule 0  . IBUPROFEN 200 MG PO TABS Oral Take 400 mg by mouth every 6 (six) hours as needed. Pain    . LISINOPRIL 2.5 MG PO TABS Oral Take 2.5 mg by mouth daily.    . TRAMADOL HCL 50 MG PO TABS Oral Take 1 tablet (50 mg total) by mouth every 6 (six) hours as needed for pain. 30 tablet 0    BP 129/77  Pulse 84  Temp 98.1 F (36.7 C) (Oral)  Resp 20  Ht 5\' 3"  (1.6 m)  Wt 150 lb (68.04 kg)  BMI 26.57 kg/m2  SpO2 100%  LMP 12/17/2011  Physical Exam  Nursing note and vitals reviewed. Constitutional: She is oriented to person, place, and time. She appears well-developed and well-nourished. No distress.  HENT:  Head: Normocephalic and atraumatic.    Mouth/Throat: Uvula is midline, oropharynx is clear and moist and mucous membranes are normal. Dental abscesses and dental caries present. No uvula swelling.    Eyes: EOM are normal.  Neck: Normal range of motion.  Cardiovascular: Normal rate, regular rhythm and normal heart sounds.   Pulmonary/Chest: Effort normal and breath sounds normal.  Abdominal: Soft. She exhibits no distension. There is no tenderness.  Musculoskeletal: Normal range of motion.  Neurological: She is alert and oriented to person, place, and time.  Skin: Skin is warm  and dry.  Psychiatric: She has a normal mood and affect. Judgment normal.    ED Course  Procedures (including critical care time)  Labs Reviewed - No data to display No results found.   1. Abscessed tooth       MDM  Clindamycin and tramadol rx's F/u with dentist ASAP        Evalina Field, PA 01/31/12 1420

## 2012-01-24 NOTE — ED Notes (Signed)
Pt presents with left lower front molar. Noted swelling to left lower jaw. Pt denies fever,n/v at this time.  Pt states abscess has been draining as she can taste the drainage in her mouth. Pt reports having missed her dental appointment last month, and has rescheduled her appointment for the 26 th of this month pending insurance approval. Pt was given a resource dental guide to include information regarding a free dental clinic.

## 2012-01-31 NOTE — ED Provider Notes (Signed)
Medical screening examination/treatment/procedure(s) were performed by non-physician practitioner and as supervising physician I was immediately available for consultation/collaboration.   Benny Lennert, MD 01/31/12 [phone]

## 2012-02-02 ENCOUNTER — Emergency Department: Payer: Self-pay | Admitting: Internal Medicine

## 2012-02-11 ENCOUNTER — Encounter (HOSPITAL_COMMUNITY): Payer: Self-pay | Admitting: *Deleted

## 2012-02-11 ENCOUNTER — Emergency Department (HOSPITAL_COMMUNITY)
Admission: EM | Admit: 2012-02-11 | Discharge: 2012-02-11 | Disposition: A | Discharge status: Home / Self Care | Payer: Self-pay | Attending: Emergency Medicine | Admitting: Emergency Medicine

## 2012-02-11 DIAGNOSIS — F17200 Nicotine dependence, unspecified, uncomplicated: Secondary | ICD-10-CM | POA: Insufficient documentation

## 2012-02-11 DIAGNOSIS — K047 Periapical abscess without sinus: Secondary | ICD-10-CM | POA: Insufficient documentation

## 2012-02-11 DIAGNOSIS — I10 Essential (primary) hypertension: Secondary | ICD-10-CM | POA: Insufficient documentation

## 2012-02-11 DIAGNOSIS — J45909 Unspecified asthma, uncomplicated: Secondary | ICD-10-CM | POA: Insufficient documentation

## 2012-02-11 DIAGNOSIS — K029 Dental caries, unspecified: Secondary | ICD-10-CM | POA: Insufficient documentation

## 2012-02-11 MED ORDER — TRAMADOL HCL 50 MG PO TABS: 50.0000 mg | ORAL_TABLET | Freq: Four times a day (QID) | ORAL | Status: DC | PRN

## 2012-02-11 NOTE — ED Notes (Signed)
Lt mandibular dental pain 

## 2012-02-11 NOTE — ED Provider Notes (Signed)
History     CSN: [id number]  Arrival date & time 02/11/12  4098   First MD Initiated Contact with Patient 02/11/12 2103      Chief Complaint  Patient presents with  . Dental Pain    (Consider location/radiation/quality/duration/timing/severity/associated sxs/prior treatment) HPI Comments: Kaylise Blakeley  presents with a 1 week history of dental pain and gingival swelling.   The patient has a history of injury to the tooth involved which has recently started to cause pain.  She reports continual small chips of tooth that keep falling off the tooth.  She does see Dr Jim Like, dentist in [place], and is awaiting her medicaid to be transferred back from [place] so she can have him pull this tooth.   There has been no fevers,  Chills, nausea or vomiting, also no complaint of difficulty swallowing,  Although chewing makes pain worse.  The patient has tried ibuprofen with some relief of symptoms.      Patient is a 26 y.o. female presenting with tooth pain. The history is provided by the patient.  Dental PainPrimary symptoms do not include fever, shortness of breath or sore throat.  Additional symptoms do not include: facial swelling.    Past Medical History  Diagnosis Date  . Hypertension   . Asthma     History reviewed. No pertinent past surgical history.  History reviewed. No pertinent family history.  History  Substance Use Topics  . Smoking status: Current Every Day Smoker -- 0.5 packs/day  . Smokeless tobacco: Not on file  . Alcohol Use: No     ocassionally    OB History    Grav Para Term Preterm Abortions TAB SAB Ect Mult Living                  Review of Systems  Constitutional: Negative for fever.  HENT: Positive for dental problem. Negative for sore throat, facial swelling, neck pain and neck stiffness.   Respiratory: Negative for shortness of breath.     Allergies  Penicillins  Home Medications   Current Outpatient Rx  Name Route Sig Dispense Refill  . ALBUTEROL  SULFATE HFA 108 (90 BASE) MCG/ACT IN AERS Inhalation Inhale 2 puffs into the lungs every 6 (six) hours as needed. For shortness of breath    . IBUPROFEN 200 MG PO TABS Oral Take 400 mg by mouth every 6 (six) hours as needed. Pain    . LISINOPRIL 2.5 MG PO TABS Oral Take 2.5 mg by mouth daily.    . TRAMADOL HCL 50 MG PO TABS Oral Take 1 tablet (50 mg total) by mouth every 6 (six) hours as needed for pain. 25 tablet 0    BP 151/91  Pulse 77  Temp 98.3 F (36.8 C) (Oral)  Resp 20  Ht 5\' 3"  (1.6 m)  Wt 150 lb (68.04 kg)  BMI 26.57 kg/m2  SpO2 100%  LMP 01/14/2012  Physical Exam  Constitutional: She is oriented to person, place, and time. She appears well-developed and well-nourished. No distress.  HENT:  Head: Normocephalic and atraumatic.  Right Ear: Tympanic membrane and external ear normal.  Left Ear: Tympanic membrane and external ear normal.  Mouth/Throat: Oropharynx is clear and moist and mucous membranes are normal. No oral lesions. Dental abscesses present.    Eyes: Conjunctivae normal are normal.  Neck: Normal range of motion. Neck supple.  Cardiovascular: Normal rate and normal heart sounds.   Pulmonary/Chest: Effort normal.  Abdominal: She exhibits no distension.  Musculoskeletal: Normal  range of motion.  Lymphadenopathy:    She has no cervical adenopathy.  Neurological: She is alert and oriented to person, place, and time.  Skin: Skin is warm and dry. No erythema.  Psychiatric: She has a normal mood and affect.    ED Course  Procedures (including critical care time)  Labs Reviewed - No data to display No results found.   1. Dental cavity       MDM  Ultram.  Pt encouraged to continue taking ibuprofen.  Consider dental putty.  F/u with dentist.        Burgess Amor, PA 02/11/12 2153

## 2012-02-12 NOTE — ED Provider Notes (Signed)
Medical screening examination/treatment/procedure(s) were performed by non-physician practitioner and as supervising physician I was immediately available for consultation/collaboration.   Shelda Jakes, MD 02/12/12 2104

## 2012-02-16 ENCOUNTER — Emergency Department: Payer: Self-pay | Admitting: Emergency Medicine

## 2012-02-25 ENCOUNTER — Encounter (HOSPITAL_COMMUNITY): Payer: Self-pay | Admitting: *Deleted

## 2012-02-25 ENCOUNTER — Emergency Department (HOSPITAL_COMMUNITY)
Admission: EM | Admit: 2012-02-25 | Discharge: 2012-02-25 | Disposition: A | Discharge status: Home / Self Care | Payer: Self-pay | Attending: Emergency Medicine | Admitting: Emergency Medicine

## 2012-02-25 DIAGNOSIS — F17200 Nicotine dependence, unspecified, uncomplicated: Secondary | ICD-10-CM | POA: Insufficient documentation

## 2012-02-25 DIAGNOSIS — I10 Essential (primary) hypertension: Secondary | ICD-10-CM | POA: Insufficient documentation

## 2012-02-25 DIAGNOSIS — K0889 Other specified disorders of teeth and supporting structures: Secondary | ICD-10-CM | POA: Insufficient documentation

## 2012-02-25 DIAGNOSIS — J45909 Unspecified asthma, uncomplicated: Secondary | ICD-10-CM | POA: Insufficient documentation

## 2012-02-25 DIAGNOSIS — Z880 Allergy status to penicillin: Secondary | ICD-10-CM | POA: Insufficient documentation

## 2012-02-25 MED ORDER — HYDROCODONE-ACETAMINOPHEN 5-325 MG PO TABS
1.0000 | ORAL_TABLET | Freq: Once | ORAL | Status: AC
  Administered 2012-02-25: 1 via ORAL
  Filled 2012-02-25: qty 1

## 2012-02-25 MED ORDER — IBUPROFEN 800 MG PO TABS
800.0000 mg | ORAL_TABLET | Freq: Once | ORAL | Status: DC
  Filled 2012-02-25: qty 1

## 2012-02-25 MED ORDER — CLINDAMYCIN HCL 150 MG PO CAPS: ORAL_CAPSULE | ORAL | Status: DC

## 2012-02-25 MED ORDER — HYDROCODONE-ACETAMINOPHEN 5-325 MG PO TABS: 1.0000 | ORAL_TABLET | Freq: Four times a day (QID) | ORAL | Status: DC | PRN

## 2012-02-25 MED ORDER — TRAMADOL HCL 50 MG PO TABS: 50.0000 mg | ORAL_TABLET | Freq: Four times a day (QID) | ORAL | Status: DC | PRN

## 2012-02-25 NOTE — ED Notes (Signed)
Pt c/o continued dental pain, pt states that the tooth has been hurting "for awhile" but she is not able to be seen by dentist due to issues with medicaid,

## 2012-02-25 NOTE — ED Provider Notes (Signed)
History     CSN: [id number]  Arrival date & time 02/25/12  1703   First MD Initiated Contact with Patient 02/25/12 1729      Chief Complaint  Patient presents with  . Dental Pain    (Consider location/radiation/quality/duration/timing/severity/associated sxs/prior treatment) HPI Comments: Contacted a dentist today that is willing to extract the tooth for cash.  Will see her on 03-24-12.  No fever or chills.  Tooth recently broke.  Patient is a 26 y.o. female presenting with tooth pain. The history is provided by the patient. No language interpreter was used.  Dental PainThe primary symptoms include mouth pain. Primary symptoms do not include fever. Episode onset: several days ago. The symptoms are unchanged. The symptoms occur constantly.  Additional symptoms do not include: gum swelling, purulent gums, trismus, jaw pain, facial swelling, trouble swallowing and ear pain.    Past Medical History  Diagnosis Date  . Hypertension   . Asthma     History reviewed. No pertinent past surgical history.  No family history on file.  History  Substance Use Topics  . Smoking status: Current Every Day Smoker -- 0.5 packs/day  . Smokeless tobacco: Not on file  . Alcohol Use: No     ocassionally    OB History    Grav Para Term Preterm Abortions TAB SAB Ect Mult Living                  Review of Systems  Constitutional: Negative for fever and chills.  HENT: Positive for dental problem. Negative for ear pain, facial swelling and trouble swallowing.   Cardiovascular: Negative for chest pain.  All other systems reviewed and are negative.    Allergies  Penicillins  Home Medications   Current Outpatient Rx  Name Route Sig Dispense Refill  . CLINDAMYCIN HCL 150 MG PO CAPS Oral Take 300 mg by mouth 3 (three) times daily.    . IBUPROFEN 200 MG PO TABS Oral Take 400 mg by mouth every 6 (six) hours as needed. Pain    . LISINOPRIL 2.5 MG PO TABS Oral Take 2.5 mg by mouth daily.      Marland Kitchen NAPROXEN 500 MG PO TABS Oral Take 500 mg by mouth 2 (two) times daily with a meal.    . ALBUTEROL SULFATE HFA 108 (90 BASE) MCG/ACT IN AERS Inhalation Inhale 2 puffs into the lungs every 6 (six) hours as needed. For shortness of breath    . CLINDAMYCIN HCL 150 MG PO CAPS  2 po TID 60 capsule 0  . HYDROCODONE-ACETAMINOPHEN 5-325 MG PO TABS Oral Take 1 tablet by mouth every 6 (six) hours as needed for pain. 20 tablet 0    BP 155/91  Pulse 79  Temp 98.2 F (36.8 C) (Oral)  Resp 20  Ht 5\' 3"  (1.6 m)  Wt 150 lb (68.04 kg)  BMI 26.57 kg/m2  SpO2 100%  LMP 02/25/2012  Physical Exam  Nursing note and vitals reviewed. Constitutional: She is oriented to person, place, and time. She appears well-developed and well-nourished. No distress.  HENT:  Head: Normocephalic and atraumatic.  Right Ear: External ear normal.  Left Ear: External ear normal.  Mouth/Throat: Uvula is midline, oropharynx is clear and moist and mucous membranes are normal. Normal dentition. No dental abscesses, uvula swelling or dental caries.    Eyes: EOM are normal.  Neck: Normal range of motion.  Cardiovascular: Normal rate, regular rhythm and normal heart sounds.   Pulmonary/Chest: Effort normal and breath sounds  normal.  Abdominal: Soft. She exhibits no distension. There is no tenderness.  Musculoskeletal: Normal range of motion.  Neurological: She is alert and oriented to person, place, and time.  Skin: Skin is warm and dry.  Psychiatric: She has a normal mood and affect. Judgment normal.    ED Course  Procedures (including critical care time)  Labs Reviewed - No data to display No results found.   1. Pain, dental       MDM  rx-clindamycin 300 mg TID x 10 days rx-hydrocodone, 20 F/u with dentist as planned.        Evalina Field, [place] 02/25/12 [phone]

## 2012-02-26 NOTE — ED Provider Notes (Signed)
Medical screening examination/treatment/procedure(s) were performed by non-physician practitioner and as supervising physician I was immediately available for consultation/collaboration.   Carnie Bruemmer L Jaimon Bugaj, MD 02/26/12 1628 

## 2012-03-04 ENCOUNTER — Emergency Department (HOSPITAL_COMMUNITY)
Admission: EM | Admit: 2012-03-04 | Discharge: 2012-03-05 | Disposition: A | Discharge status: Home / Self Care | Payer: Self-pay | Attending: Emergency Medicine | Admitting: Emergency Medicine

## 2012-03-04 ENCOUNTER — Encounter (HOSPITAL_COMMUNITY): Payer: Self-pay

## 2012-03-04 DIAGNOSIS — Z79899 Other long term (current) drug therapy: Secondary | ICD-10-CM | POA: Insufficient documentation

## 2012-03-04 DIAGNOSIS — R51 Headache: Secondary | ICD-10-CM | POA: Insufficient documentation

## 2012-03-04 DIAGNOSIS — I10 Essential (primary) hypertension: Secondary | ICD-10-CM | POA: Insufficient documentation

## 2012-03-04 DIAGNOSIS — R1110 Vomiting, unspecified: Secondary | ICD-10-CM | POA: Insufficient documentation

## 2012-03-04 DIAGNOSIS — J45909 Unspecified asthma, uncomplicated: Secondary | ICD-10-CM | POA: Insufficient documentation

## 2012-03-04 DIAGNOSIS — Z792 Long term (current) use of antibiotics: Secondary | ICD-10-CM | POA: Insufficient documentation

## 2012-03-04 DIAGNOSIS — F17200 Nicotine dependence, unspecified, uncomplicated: Secondary | ICD-10-CM | POA: Insufficient documentation

## 2012-03-04 MED ORDER — DIPHENHYDRAMINE HCL 25 MG PO CAPS: 25.0000 mg | ORAL_CAPSULE | Freq: Once | ORAL | Status: DC

## 2012-03-04 MED ORDER — HYDROMORPHONE HCL PF 1 MG/ML IJ SOLN: 1.0000 mg | Freq: Once | INTRAMUSCULAR | Status: DC

## 2012-03-04 MED ORDER — ONDANSETRON 8 MG PO TBDP: 8.0000 mg | ORAL_TABLET | Freq: Once | ORAL | Status: DC

## 2012-03-04 NOTE — ED Notes (Signed)
States she has not been feeling well and has this "funk" that is going around. States she vomited at has a headache on the right side of her head at her temple area, states she feels pressure in her right eye.  States they were looking on the computer and was reading about aneurisms and it scared her and she wanted to be checked out.  States she has had migraines before, but this is different.

## 2012-03-04 NOTE — ED Provider Notes (Signed)
History  This chart was scribed for [employer]. Colon Branch, MD by Bennett Scrape. This patient was seen in room [facility] and the patient's care was started at 11:34PM.  CSN: [id number]  Arrival date & time 03/04/12  2308   First MD Initiated Contact with Patient 03/04/12 2334      Chief Complaint  Patient presents with  . Headache  . Emesis     The history is provided by the patient. No language interpreter was used.    Laurie Stephens is a 26 y.o. female who presents to the Emergency Department complaining of approximately 14 hours of gradual onset, gradually worsening, constant HA located in the right temporal region that radiates into the right parietal region and down into the right neck that started after one episode of non-bloody emesis. She reports taking ibuprofen and Ultram with no improvement in her symptoms. Pt reports that she has been experiencing epigastric abdominal pain, nausea, emesis and diarrhea for the past couple of days as well. She has a h/o HTN and asthma. She is a current everyday smoker but denies alcohol use.  Pt is right handed.  PCP is Dr. Dear.  Past Medical History  Diagnosis Date  . Hypertension   . Asthma     History reviewed. No pertinent past surgical history.  No family history on file.  History  Substance Use Topics  . Smoking status: Current Every Day Smoker -- 0.5 packs/day  . Smokeless tobacco: Not on file  . Alcohol Use: No     ocassionally   No OB history provided.  Review of Systems  Constitutional: Negative for fever.       10 Systems reviewed and are negative for acute change except as noted in the HPI.  HENT: Negative for congestion.   Eyes: Negative for discharge and redness.  Respiratory: Negative for cough and shortness of breath.   Cardiovascular: Negative for chest pain.  Gastrointestinal: Negative for vomiting and abdominal pain.  Musculoskeletal: Negative for back pain.  Skin: Negative for rash.  Neurological:  Positive for headaches. Negative for syncope and numbness.  Psychiatric/Behavioral:       No behavior change.    A complete 10 system review of systems was obtained and all systems are negative except as noted in the HPI and PMH.   Allergies  Penicillins  Home Medications   Current Outpatient Rx  Name Route Sig Dispense Refill  . ALBUTEROL SULFATE HFA 108 (90 BASE) MCG/ACT IN AERS Inhalation Inhale 2 puffs into the lungs every 6 (six) hours as needed. For shortness of breath    . CLINDAMYCIN HCL 150 MG PO CAPS Oral Take 300 mg by mouth 3 (three) times daily.    Marland Kitchen CLINDAMYCIN HCL 150 MG PO CAPS  2 po TID 60 capsule 0  . IBUPROFEN 200 MG PO TABS Oral Take 400 mg by mouth every 6 (six) hours as needed. Pain    . LISINOPRIL 2.5 MG PO TABS Oral Take 2.5 mg by mouth daily.    Marland Kitchen NAPROXEN 500 MG PO TABS Oral Take 500 mg by mouth 2 (two) times daily with a meal.    . TRAMADOL HCL 50 MG PO TABS Oral Take 1 tablet (50 mg total) by mouth every 6 (six) hours as needed for pain. 20 tablet 0    Triage Vitals: BP 168/96  Pulse 88  Temp 98.3 F (36.8 C) (Oral)  Resp 20  Ht 5\' 3"  (1.6 m)  Wt 150 lb (68.04 kg)  BMI 26.57 kg/m2  SpO2 100%  LMP 02/25/2012  Physical Exam  Nursing note and vitals reviewed. Constitutional: She is oriented to person, place, and time. She appears well-developed and well-nourished. No distress.  HENT:  Head: Normocephalic and atraumatic.  Eyes: Conjunctivae normal and EOM are normal. Pupils are equal, round, and reactive to light.  Neck: Neck supple. No tracheal deviation present.  Cardiovascular: Normal rate and regular rhythm.  Exam reveals no gallop and no friction rub.   No murmur heard. Pulmonary/Chest: Effort normal and breath sounds normal. No respiratory distress.  Abdominal: Soft. Bowel sounds are normal. There is no tenderness.  Musculoskeletal: Normal range of motion.  Neurological: She is alert and oriented to person, place, and time. No cranial nerve  deficit.       No facial asymmetry, no neurological deficits, speech is normal  Skin: Skin is warm and dry.  Psychiatric: She has a normal mood and affect. Her behavior is normal.      ED Course  Procedures (including critical care time)  DIAGNOSTIC STUDIES: Oxygen Saturation is 100% on room air, normal by my interpretation.    COORDINATION OF CARE: 11:56Pm-Patient informed of current plan for treatment and evaluation and agrees with plan at this time.  0020 Patient would rather take her medicines as home as she drove herself.     MDM  Patient with right sided headache that began today after vomiting. Has not responded to tylenol or ibuprofen. No focal neurological findings. Will discharge home with medicines to take once home. Pt stable in ED with no significant deterioration in condition.The patient appears reasonably screened and/or stabilized for discharge and I doubt any other medical condition or other [facility] requiring further screening, evaluation, or treatment in the ED at this time prior to discharge.  I personally performed the services described in this documentation, which was scribed in my presence. The recorded information has been reviewed and considered.   MDM Reviewed: nursing note and vitals           Nicoletta Dress. Colon Branch, MD 03/05/12 [phone]

## 2012-03-04 NOTE — ED Notes (Signed)
Sick, vomited earlier today per pt. Headache, hurting behind right eye. Head is bothering me so bad per pt.

## 2012-03-05 MED ORDER — ONDANSETRON HCL 4 MG PO TABS: 4.0000 mg | ORAL_TABLET | Freq: Four times a day (QID) | ORAL | Status: DC

## 2012-03-05 MED ORDER — HYDROCODONE-ACETAMINOPHEN 5-325 MG PO TABS
2.0000 | ORAL_TABLET | Freq: Once | ORAL | Status: AC
  Administered 2012-03-05: 2 via ORAL
  Filled 2012-03-05: qty 2

## 2012-03-05 NOTE — ED Notes (Addendum)
States that she would rather have medication at home, states she does not have a way home and does not want to wait here after receiving medications, MD made aware.

## 2012-03-27 ENCOUNTER — Emergency Department (HOSPITAL_COMMUNITY)
Admission: EM | Admit: 2012-03-27 | Discharge: 2012-03-27 | Disposition: A | Discharge status: Home / Self Care | Payer: Self-pay | Attending: Emergency Medicine | Admitting: Emergency Medicine

## 2012-03-27 ENCOUNTER — Encounter (HOSPITAL_COMMUNITY): Payer: Self-pay | Admitting: *Deleted

## 2012-03-27 DIAGNOSIS — Z791 Long term (current) use of non-steroidal anti-inflammatories (NSAID): Secondary | ICD-10-CM | POA: Insufficient documentation

## 2012-03-27 DIAGNOSIS — K089 Disorder of teeth and supporting structures, unspecified: Secondary | ICD-10-CM | POA: Insufficient documentation

## 2012-03-27 DIAGNOSIS — Z79899 Other long term (current) drug therapy: Secondary | ICD-10-CM | POA: Insufficient documentation

## 2012-03-27 DIAGNOSIS — K0889 Other specified disorders of teeth and supporting structures: Secondary | ICD-10-CM

## 2012-03-27 DIAGNOSIS — F17200 Nicotine dependence, unspecified, uncomplicated: Secondary | ICD-10-CM | POA: Insufficient documentation

## 2012-03-27 MED ORDER — IBUPROFEN 800 MG PO TABS
800.0000 mg | ORAL_TABLET | Freq: Once | ORAL | Status: AC
  Administered 2012-03-27: 800 mg via ORAL
  Filled 2012-03-27: qty 1

## 2012-03-27 MED ORDER — CLINDAMYCIN HCL 150 MG PO CAPS
150.0000 mg | ORAL_CAPSULE | Freq: Once | ORAL | Status: AC
  Administered 2012-03-27: 150 mg via ORAL
  Filled 2012-03-27: qty 1

## 2012-03-27 MED ORDER — TRAMADOL HCL 50 MG PO TABS: 50.0000 mg | ORAL_TABLET | Freq: Four times a day (QID) | ORAL | Status: DC | PRN

## 2012-03-27 MED ORDER — CLINDAMYCIN HCL 150 MG PO CAPS: ORAL_CAPSULE | ORAL | Status: DC

## 2012-03-27 MED ORDER — TRAMADOL HCL 50 MG PO TABS
50.0000 mg | ORAL_TABLET | Freq: Once | ORAL | Status: AC
  Administered 2012-03-27: 50 mg via ORAL
  Filled 2012-03-27: qty 1

## 2012-03-27 MED ORDER — ONDANSETRON HCL 4 MG PO TABS
4.0000 mg | ORAL_TABLET | Freq: Once | ORAL | Status: AC
  Administered 2012-03-27: 4 mg via ORAL
  Filled 2012-03-27: qty 1

## 2012-03-27 NOTE — ED Notes (Signed)
Dental abscess to left side of mouth x 4 days.

## 2012-03-27 NOTE — ED Provider Notes (Signed)
History     CSN: [id number]  Arrival date & time 03/27/12  1751   First MD Initiated Contact with Patient 03/27/12 2016      Chief Complaint  Patient presents with  . dental abscess     (Consider location/radiation/quality/duration/timing/severity/associated sxs/prior treatment) Patient is a 26 y.o. female presenting with tooth pain. The history is provided by the patient.  Dental PainThe primary symptoms include mouth pain. Primary symptoms do not include fever, shortness of breath or cough. The symptoms began 3 to 5 days ago. The symptoms are unchanged. The symptoms are recurrent. The symptoms occur constantly.  Additional symptoms include: dental sensitivity to temperature, gum swelling and jaw pain. Additional symptoms do not include: nosebleeds. Medical issues include: smoking.    Past Medical History  Diagnosis Date  . Hypertension   . Asthma     History reviewed. No pertinent past surgical history.  No family history on file.  History  Substance Use Topics  . Smoking status: Current Every Day Smoker -- 0.5 packs/day    Types: Cigarettes  . Smokeless tobacco: Not on file  . Alcohol Use: No     Comment: ocassionally    OB History    Grav Para Term Preterm Abortions TAB SAB Ect Mult Living                  Review of Systems  Constitutional: Negative for fever and activity change.       All ROS Neg except as noted in HPI  HENT: Positive for dental problem. Negative for nosebleeds and neck pain.   Eyes: Negative for photophobia and discharge.  Respiratory: Positive for wheezing. Negative for cough and shortness of breath.   Cardiovascular: Negative for chest pain and palpitations.  Gastrointestinal: Negative for abdominal pain and blood in stool.  Genitourinary: Negative for dysuria, frequency and hematuria.  Musculoskeletal: Negative for back pain and arthralgias.  Skin: Negative.   Neurological: Negative for dizziness, seizures and speech difficulty.    Psychiatric/Behavioral: Negative for hallucinations and confusion.    Allergies  Penicillins  Home Medications   Current Outpatient Rx  Name  Route  Sig  Dispense  Refill  . ALBUTEROL SULFATE HFA 108 (90 BASE) MCG/ACT IN AERS   Inhalation   Inhale 2 puffs into the lungs every 6 (six) hours as needed. For shortness of breath         . CLINDAMYCIN HCL 150 MG PO CAPS   Oral   Take 300 mg by mouth 3 (three) times daily.         Marland Kitchen CLINDAMYCIN HCL 150 MG PO CAPS      2 po TID   60 capsule   0   . CLINDAMYCIN HCL 150 MG PO CAPS      1 po tid with food   21 capsule   0   . IBUPROFEN 200 MG PO TABS   Oral   Take 400 mg by mouth every 6 (six) hours as needed. Pain         . LISINOPRIL 2.5 MG PO TABS   Oral   Take 2.5 mg by mouth daily.         Marland Kitchen NAPROXEN 500 MG PO TABS   Oral   Take 500 mg by mouth 2 (two) times daily with a meal.         . ONDANSETRON HCL 4 MG PO TABS   Oral   Take 1 tablet (4 mg total) by mouth every  6 (six) hours.   12 tablet   0   . TRAMADOL HCL 50 MG PO TABS   Oral   Take 1 tablet (50 mg total) by mouth every 6 (six) hours as needed for pain.   20 tablet   0   . TRAMADOL HCL 50 MG PO TABS   Oral   Take 1 tablet (50 mg total) by mouth every 6 (six) hours as needed for pain.   20 tablet   0     BP 122/91  Pulse 100  Temp 97.8 F (36.6 C) (Oral)  Resp 16  Ht 5\' 2"  (1.575 m)  Wt 150 lb (68.04 kg)  BMI 27.44 kg/m2  SpO2 97%  LMP 03/14/2012  Physical Exam  Nursing note and vitals reviewed. Constitutional: She is oriented to person, place, and time. She appears well-developed and well-nourished.  Non-toxic appearance.  HENT:  Head: Normocephalic.  Right Ear: Tympanic membrane and external ear normal.  Left Ear: Tympanic membrane and external ear normal.  Mouth/Throat:         Swelling of the gum and tenderness of the lower teeth to palpation on the left. Small abscess noted. No swelling under the tongue. Airway  patent.  Eyes: EOM and lids are normal. Pupils are equal, round, and reactive to light.  Neck: Normal range of motion. Neck supple. Carotid bruit is not present.  Cardiovascular: Normal rate, regular rhythm, normal heart sounds, intact distal pulses and normal pulses.   No murmur heard. Pulmonary/Chest: Breath sounds normal. No respiratory distress.  Abdominal: Soft. Bowel sounds are normal. There is no tenderness. There is no guarding.  Musculoskeletal: Normal range of motion.  Lymphadenopathy:       Head (right side): No submandibular adenopathy present.       Head (left side): No submandibular adenopathy present.    She has no cervical adenopathy.  Neurological: She is alert and oriented to person, place, and time. She has normal strength. No cranial nerve deficit or sensory deficit.  Skin: Skin is warm and dry.  Psychiatric: She has a normal mood and affect. Her speech is normal.    ED Course  Procedures (including critical care time)  Labs Reviewed - No data to display No results found.   1. Toothache       MDM  I have reviewed nursing notes, vital signs, and all appropriate lab and imaging results for this patient. Patient states she has been having problems approximately every month for the last 3-4 months with the same tooth. Patient states she does not have insurance and she does not have identification to go to any of the free clinic's. Suggested the patient to possibly check with the [place] [facility] of dentistry. Prescription for clindamycin 150 mg, and Ultram 50 mg #20 tablets given to the patient.       Kathie Dike, PA 03/27/12 2103  Kathie Dike, PA 04/09/12 1610

## 2012-04-09 NOTE — ED Provider Notes (Signed)
History/physical exam/procedure(s) were performed by non-physician practitioner and as supervising physician I was immediately available for consultation/collaboration. I have reviewed all notes and am in agreement with care and plan.   Hilario Quarry, MD 04/09/12 [phone]

## 2012-05-07 ENCOUNTER — Emergency Department (HOSPITAL_COMMUNITY)
Admission: EM | Admit: 2012-05-07 | Discharge: 2012-05-07 | Disposition: A | Discharge status: Home / Self Care | Payer: Self-pay | Attending: Emergency Medicine | Admitting: Emergency Medicine

## 2012-05-07 ENCOUNTER — Encounter (HOSPITAL_COMMUNITY): Payer: Self-pay | Admitting: *Deleted

## 2012-05-07 DIAGNOSIS — K0889 Other specified disorders of teeth and supporting structures: Secondary | ICD-10-CM

## 2012-05-07 DIAGNOSIS — S025XXA Fracture of tooth (traumatic), initial encounter for closed fracture: Secondary | ICD-10-CM

## 2012-05-07 DIAGNOSIS — I10 Essential (primary) hypertension: Secondary | ICD-10-CM | POA: Insufficient documentation

## 2012-05-07 DIAGNOSIS — K0381 Cracked tooth: Secondary | ICD-10-CM | POA: Insufficient documentation

## 2012-05-07 DIAGNOSIS — F17200 Nicotine dependence, unspecified, uncomplicated: Secondary | ICD-10-CM | POA: Insufficient documentation

## 2012-05-07 DIAGNOSIS — K089 Disorder of teeth and supporting structures, unspecified: Secondary | ICD-10-CM | POA: Insufficient documentation

## 2012-05-07 DIAGNOSIS — J45909 Unspecified asthma, uncomplicated: Secondary | ICD-10-CM | POA: Insufficient documentation

## 2012-05-07 DIAGNOSIS — K047 Periapical abscess without sinus: Secondary | ICD-10-CM | POA: Insufficient documentation

## 2012-05-07 MED ORDER — CLINDAMYCIN HCL 150 MG PO CAPS: 300.0000 mg | ORAL_CAPSULE | Freq: Three times a day (TID) | ORAL | Status: DC

## 2012-05-07 MED ORDER — TRAMADOL HCL 50 MG PO TABS: 50.0000 mg | ORAL_TABLET | Freq: Four times a day (QID) | ORAL | Status: DC | PRN

## 2012-05-07 NOTE — ED Notes (Signed)
Pt with dental pain for awhile, has been seen here for same, denies seeing a dentist

## 2012-05-08 NOTE — ED Provider Notes (Signed)
History     CSN: [id number]  Arrival date & time 05/07/12  1417   First MD Initiated Contact with Patient 05/07/12 1429      Chief Complaint  Patient presents with  . Dental Pain    (Consider location/radiation/quality/duration/timing/severity/associated sxs/prior treatment) HPI Comments: Laurie Stephens  presents with chronic intermittent dental pain and has now developed  gingival swelling around the affected tooth over the past several days.   The patient has a history of injury to the tooth involved which has recently started to cause pain.  There has been no fevers,  Chills, nausea or vomiting, also no complaint of difficulty swallowing,  Although chewing makes pain worse.  The patient has tried tylenol without relief of symptoms.      The history is provided by the patient.    Past Medical History  Diagnosis Date  . Hypertension   . Asthma     History reviewed. No pertinent past surgical history.  History reviewed. No pertinent family history.  History  Substance Use Topics  . Smoking status: Current Every Day Smoker -- 0.5 packs/day    Types: Cigarettes  . Smokeless tobacco: Not on file  . Alcohol Use: No     Comment: ocassionally    OB History    Grav Para Term Preterm Abortions TAB SAB Ect Mult Living                  Review of Systems  Constitutional: Negative for fever.  HENT: Positive for dental problem. Negative for sore throat, facial swelling, neck pain and neck stiffness.   Respiratory: Negative for shortness of breath.     Allergies  Penicillins  Home Medications   Current Outpatient Rx  Name  Route  Sig  Dispense  Refill  . CLINDAMYCIN HCL 150 MG PO CAPS   Oral   Take 2 capsules (300 mg total) by mouth 3 (three) times daily.   30 capsule   0   . TRAMADOL HCL 50 MG PO TABS   Oral   Take 1 tablet (50 mg total) by mouth every 6 (six) hours as needed for pain.   15 tablet   0     BP 150/88  Pulse 80  Temp 97.7 F (36.5 C)  (Oral)  Resp 18  Ht 5\' 3"  (1.6 m)  Wt 150 lb (68.04 kg)  BMI 26.57 kg/m2  SpO2 99%  LMP 04/16/2012  Physical Exam  Constitutional: She is oriented to person, place, and time. She appears well-developed and well-nourished. No distress.  HENT:  Head: Normocephalic and atraumatic. No trismus in the jaw.  Right Ear: Tympanic membrane and external ear normal.  Left Ear: Tympanic membrane and external ear normal.  Mouth/Throat: Oropharynx is clear and moist and mucous membranes are normal. No oral lesions. Dental abscesses present.    Eyes: Conjunctivae normal are normal.  Neck: Normal range of motion. Neck supple.  Cardiovascular: Normal rate and normal heart sounds.   Pulmonary/Chest: Effort normal.  Abdominal: She exhibits no distension.  Musculoskeletal: Normal range of motion.  Lymphadenopathy:    She has no cervical adenopathy.  Neurological: She is alert and oriented to person, place, and time.  Skin: Skin is warm and dry. No erythema.  Psychiatric: She has a normal mood and affect.    ED Course  Procedures (including critical care time)  Labs Reviewed - No data to display No results found.   1. Broken tooth   2. Pain, dental  MDM  Pt prescribed clindamycin and tramadol for dental abscess.  Dental referrals given.  The patient appears reasonably screened and/or stabilized for discharge and I doubt any other medical condition or other [facility] requiring further screening, evaluation, or treatment in the ED at this time prior to discharge.         Burgess Amor, [place] 05/08/12 2311

## 2012-05-16 NOTE — ED Provider Notes (Signed)
Medical screening examination/treatment/procedure(s) were performed by non-physician practitioner and as supervising physician I was immediately available for consultation/collaboration.  Shelda Jakes, MD 05/16/12 [phone]

## 2012-06-01 ENCOUNTER — Emergency Department (HOSPITAL_COMMUNITY)
Admission: EM | Admit: 2012-06-01 | Discharge: 2012-06-01 | Disposition: A | Discharge status: Home / Self Care | Payer: Self-pay | Attending: Emergency Medicine | Admitting: Emergency Medicine

## 2012-06-01 ENCOUNTER — Encounter (HOSPITAL_COMMUNITY): Payer: Self-pay | Admitting: *Deleted

## 2012-06-01 DIAGNOSIS — K029 Dental caries, unspecified: Secondary | ICD-10-CM | POA: Insufficient documentation

## 2012-06-01 DIAGNOSIS — I10 Essential (primary) hypertension: Secondary | ICD-10-CM | POA: Insufficient documentation

## 2012-06-01 DIAGNOSIS — F17200 Nicotine dependence, unspecified, uncomplicated: Secondary | ICD-10-CM | POA: Insufficient documentation

## 2012-06-01 DIAGNOSIS — J45909 Unspecified asthma, uncomplicated: Secondary | ICD-10-CM | POA: Insufficient documentation

## 2012-06-01 DIAGNOSIS — K0889 Other specified disorders of teeth and supporting structures: Secondary | ICD-10-CM

## 2012-06-01 DIAGNOSIS — K089 Disorder of teeth and supporting structures, unspecified: Secondary | ICD-10-CM | POA: Insufficient documentation

## 2012-06-01 MED ORDER — TRAMADOL HCL 50 MG PO TABS: 50.0000 mg | ORAL_TABLET | Freq: Four times a day (QID) | ORAL | Status: DC | PRN

## 2012-06-01 MED ORDER — NAPROXEN 250 MG PO TABS: 250.0000 mg | ORAL_TABLET | Freq: Two times a day (BID) | ORAL | Status: DC

## 2012-06-01 MED ORDER — CLINDAMYCIN HCL 150 MG PO CAPS: ORAL_CAPSULE | ORAL | Status: DC

## 2012-06-01 NOTE — ED Notes (Signed)
Patient with no complaints at this time. Respirations even and unlabored. Skin warm/dry. Discharge instructions reviewed with patient at this time. Patient given opportunity to voice concerns/ask questions. Patient discharged at this time and left Emergency Department with steady gait.   

## 2012-06-01 NOTE — ED Provider Notes (Signed)
History     CSN: [id number]  Arrival date & time 06/01/12  1618   First MD Initiated Contact with Patient 06/01/12 1756      Chief Complaint  Patient presents with  . Dental Pain    HPI Pt was seen at 1800.  Per pt, c/o gradual onset and persistence of constant left lower tooth "pain" for the past several months, worse over the past several days.  Denies fevers, no intra-oral edema, no rash, no facial swelling, no dysphagia, no neck pain.   The condition is aggravated by nothing. The condition is relieved by nothing. The symptoms have been associated with no other complaints.  The patient has a significant history of similar symptoms previously, recently being evaluated for this complaint and multiple prior evals for same.      Past Medical History  Diagnosis Date  . Hypertension   . Asthma     History reviewed. No pertinent past surgical history.    History  Substance Use Topics  . Smoking status: Current Every Day Smoker -- 0.5 packs/day    Types: Cigarettes  . Smokeless tobacco: Not on file  . Alcohol Use: No     Comment: ocassionally    Review of Systems ROS: Statement: All systems negative except as marked or noted in the HPI; Constitutional: Negative for fever and chills. ; ; Eyes: Negative for eye pain and discharge. ; ; ENMT: Positive for dental caries, dental hygiene poor and toothache. Negative for ear pain, bleeding gums, dental injury, facial deformity, facial swelling, hoarseness, nasal congestion, sinus pressure, sore throat, throat swelling and tongue swollen. ; ; Cardiovascular: Negative for chest pain, palpitations, diaphoresis, dyspnea and peripheral edema. ; ; Respiratory: Negative for cough, wheezing and stridor. ; ; Gastrointestinal: Negative for nausea, vomiting, diarrhea and abdominal pain. ; ; Genitourinary: Negative for dysuria, flank pain and hematuria. ; ; Musculoskeletal: Negative for back pain and neck pain. ; ; Skin: Negative for rash and skin  lesion. ; ; Neuro: Negative for headache, lightheadedness and neck stiffness. ;     Allergies  Penicillins  Home Medications   Current Outpatient Rx  Name  Route  Sig  Dispense  Refill  . CLINDAMYCIN HCL 150 MG PO CAPS      3 tabs PO TID x 5 days   45 capsule   0   . NAPROXEN 250 MG PO TABS   Oral   Take 1 tablet (250 mg total) by mouth 2 (two) times daily with a meal.   14 tablet   0   . TRAMADOL HCL 50 MG PO TABS   Oral   Take 1 tablet (50 mg total) by mouth every 6 (six) hours as needed for pain.   15 tablet   0     BP 144/81  Pulse 104  Temp 98.3 F (36.8 C) (Oral)  Resp 16  Ht 5\' 3"  (1.6 m)  Wt 150 lb (68.04 kg)  BMI 26.57 kg/m2  SpO2 96%  LMP 05/18/2012  Physical Exam 1805: Physical examination: Vital signs and O2 SAT: Reviewed; Constitutional: Well developed, Well nourished, Well hydrated, In no acute distress; Head and Face: Normocephalic, Atraumatic; Eyes: EOMI, PERRL, No scleral icterus; ENMT: Mouth and pharynx normal, Poor dentition, Widespread dental decay, Left TM normal, Right TM normal, Mucous membranes moist, +lower left 2nd premolar with dental decay.  No gingival erythema, edema, fluctuance, or drainage.  No intra-oral edema. No hoarse voice, no drooling, no stridor.  ; Neck: Supple, Full  range of motion, No lymphadenopathy; Cardiovascular: Regular rate and rhythm, No murmur, rub, or gallop; Respiratory: Breath sounds clear & equal bilaterally, No rales, rhonchi, wheezes, or rub, Normal respiratory effort/excursion; Chest: Nontender, Movement normal; Extremities: Pulses normal, No tenderness, No edema; Neuro: AA&Ox3, Major CN grossly intact.  No gross focal motor or sensory deficits in extremities.; Skin: Color normal, No rash, No petechiae, Warm, Dry   ED Course  Procedures     MDM  MDM Reviewed: previous chart, nursing note and vitals     1810:  Pt encouraged to f/u with dentist or oral surgeon for her dental needs for good continuity of  care and definitive treatment.  Verb understanding.         Laray Anger, DO 06/02/12 1519

## 2012-06-01 NOTE — ED Notes (Signed)
Left sided dental pain, worsening today after biting tongue ring ball.

## 2012-06-10 ENCOUNTER — Emergency Department (HOSPITAL_COMMUNITY)
Admission: EM | Admit: 2012-06-10 | Discharge: 2012-06-10 | Disposition: A | Discharge status: Home / Self Care | Payer: Self-pay | Attending: Emergency Medicine | Admitting: Emergency Medicine

## 2012-06-10 ENCOUNTER — Encounter (HOSPITAL_COMMUNITY): Payer: Self-pay | Admitting: *Deleted

## 2012-06-10 DIAGNOSIS — K089 Disorder of teeth and supporting structures, unspecified: Secondary | ICD-10-CM | POA: Insufficient documentation

## 2012-06-10 DIAGNOSIS — J45909 Unspecified asthma, uncomplicated: Secondary | ICD-10-CM | POA: Insufficient documentation

## 2012-06-10 DIAGNOSIS — Z79899 Other long term (current) drug therapy: Secondary | ICD-10-CM | POA: Insufficient documentation

## 2012-06-10 DIAGNOSIS — I10 Essential (primary) hypertension: Secondary | ICD-10-CM | POA: Insufficient documentation

## 2012-06-10 DIAGNOSIS — K0889 Other specified disorders of teeth and supporting structures: Secondary | ICD-10-CM

## 2012-06-10 DIAGNOSIS — F17200 Nicotine dependence, unspecified, uncomplicated: Secondary | ICD-10-CM | POA: Insufficient documentation

## 2012-06-10 MED ORDER — TRAMADOL HCL 50 MG PO TABS: 50.0000 mg | ORAL_TABLET | Freq: Four times a day (QID) | ORAL | Status: DC | PRN

## 2012-06-10 MED ORDER — TRAMADOL HCL 50 MG PO TABS
50.0000 mg | ORAL_TABLET | Freq: Once | ORAL | Status: AC
  Administered 2012-06-10: 50 mg via ORAL
  Filled 2012-06-10: qty 1

## 2012-06-10 NOTE — ED Provider Notes (Signed)
History     CSN: [id number]  Arrival date & time 06/10/12  1903   First MD Initiated Contact with Patient 06/10/12 1912      Chief Complaint  Patient presents with  . Dental Pain    (Consider location/radiation/quality/duration/timing/severity/associated sxs/prior treatment) Patient is a 27 y.o. female presenting with tooth pain. The history is provided by the patient.  Dental PainThe primary symptoms include mouth pain. Primary symptoms do not include headaches, fever, shortness of breath, sore throat, angioedema or cough. The symptoms are worsening. The symptoms are recurrent. The symptoms occur constantly.  Mouth pain occurs constantly. Mouth pain is worsening. Affected locations include: teeth.  Additional symptoms include: dental sensitivity to temperature and gum tenderness. Additional symptoms do not include: gum swelling, purulent gums, trismus, jaw pain, facial swelling, trouble swallowing, pain with swallowing, drooling, ear pain and swollen glands. Medical issues include: smoking and periodontal disease.    Past Medical History  Diagnosis Date  . Hypertension   . Asthma     Past Surgical History  Procedure Date  . Dilitation and curretage   . Meth addiction     06/10/2012, No use for 2 years    History reviewed. No pertinent family history.  History  Substance Use Topics  . Smoking status: Current Every Day Smoker -- 0.5 packs/day    Types: Cigarettes  . Smokeless tobacco: Not on file  . Alcohol Use: Yes     Comment: ocassionally    OB History    Grav Para Term Preterm Abortions TAB SAB Ect Mult Living                  Review of Systems  Constitutional: Negative for fever and appetite change.  HENT: Positive for dental problem. Negative for ear pain, congestion, sore throat, facial swelling, drooling, trouble swallowing, neck pain and neck stiffness.   Eyes: Negative for pain and visual disturbance.  Respiratory: Negative for cough and shortness of  breath.   Neurological: Negative for dizziness, facial asymmetry and headaches.  Hematological: Negative for adenopathy.  All other systems reviewed and are negative.    Allergies  Penicillins  Home Medications   Current Outpatient Rx  Name  Route  Sig  Dispense  Refill  . CLINDAMYCIN HCL 150 MG PO CAPS      3 tabs PO TID x 5 days   45 capsule   0   . NAPROXEN 250 MG PO TABS   Oral   Take 1 tablet (250 mg total) by mouth 2 (two) times daily with a meal.   14 tablet   0   . TRAMADOL HCL 50 MG PO TABS   Oral   Take 1 tablet (50 mg total) by mouth every 6 (six) hours as needed for pain.   15 tablet   0     BP 156/93  Pulse 84  Temp 98.3 F (36.8 C) (Oral)  Resp 18  Ht 5\' 3"  (1.6 m)  Wt 160 lb (72.576 kg)  BMI 28.34 kg/m2  SpO2 100%  LMP 06/03/2012  Physical Exam  Nursing note and vitals reviewed. Constitutional: She is oriented to person, place, and time. She appears well-developed and well-nourished. No distress.  HENT:  Head: Normocephalic and atraumatic. No trismus in the jaw.  Right Ear: Tympanic membrane and ear canal normal.  Left Ear: Tympanic membrane and ear canal normal.  Mouth/Throat: Uvula is midline, oropharynx is clear and moist and mucous membranes are normal. Dental caries present. No dental  abscesses or uvula swelling.         ttp of the left lower premolar.  Dental decay present.  No erythema or edema of the surrounding gums.  No facial edema, obvious abscess or trismus  Neck: Normal range of motion. Neck supple.  Cardiovascular: Normal rate, regular rhythm and normal heart sounds.   No murmur heard. Pulmonary/Chest: Effort normal and breath sounds normal.  Musculoskeletal: Normal range of motion.  Lymphadenopathy:    She has no cervical adenopathy.  Neurological: She is alert and oriented to person, place, and time. She exhibits normal muscle tone. Coordination normal.  Skin: Skin is warm and dry.    ED Course  Procedures (including  critical care time)  Labs Reviewed - No data to display No results found.      MDM    Previous ED chart reviewed.  Pt continues to have pain to same tooth.  Currently taking clindamycin.  Has a dentist, but does not have funds to pay for the visit  Pt agrees to continue the clindamycin.  Will prescribed ultram #20        Mahkai Fangman L. [place], [place] 06/10/12 1926

## 2012-06-10 NOTE — ED Notes (Signed)
Discharge instructions reviewed with pt, questions answered. Pt verbalized understanding.  

## 2012-06-10 NOTE — ED Provider Notes (Signed)
Medical screening examination/treatment/procedure(s) were performed by non-physician practitioner and as supervising physician I was immediately available for consultation/collaboration.   Shelda Jakes, MD 06/10/12 2002

## 2012-06-10 NOTE — ED Notes (Signed)
Dental pain for"months" seen here 1/9 for same and says pain is worse, Put aspirin on tooth and pain became worse.

## 2012-06-28 ENCOUNTER — Emergency Department (HOSPITAL_COMMUNITY): Payer: Self-pay

## 2012-06-28 ENCOUNTER — Encounter (HOSPITAL_COMMUNITY): Payer: Self-pay | Admitting: Emergency Medicine

## 2012-06-28 ENCOUNTER — Emergency Department (HOSPITAL_COMMUNITY)
Admission: EM | Admit: 2012-06-28 | Discharge: 2012-06-28 | Disposition: A | Discharge status: Home / Self Care | Payer: Self-pay | Attending: Emergency Medicine | Admitting: Emergency Medicine

## 2012-06-28 DIAGNOSIS — J45901 Unspecified asthma with (acute) exacerbation: Secondary | ICD-10-CM | POA: Insufficient documentation

## 2012-06-28 DIAGNOSIS — S9032XA Contusion of left foot, initial encounter: Secondary | ICD-10-CM

## 2012-06-28 DIAGNOSIS — F17200 Nicotine dependence, unspecified, uncomplicated: Secondary | ICD-10-CM | POA: Insufficient documentation

## 2012-06-28 DIAGNOSIS — S6390XA Sprain of unspecified part of unspecified wrist and hand, initial encounter: Secondary | ICD-10-CM | POA: Insufficient documentation

## 2012-06-28 DIAGNOSIS — S9030XA Contusion of unspecified foot, initial encounter: Secondary | ICD-10-CM | POA: Insufficient documentation

## 2012-06-28 DIAGNOSIS — I10 Essential (primary) hypertension: Secondary | ICD-10-CM | POA: Insufficient documentation

## 2012-06-28 DIAGNOSIS — S63619A Unspecified sprain of unspecified finger, initial encounter: Secondary | ICD-10-CM

## 2012-06-28 MED ORDER — TRAMADOL HCL 50 MG PO TABS: 50.0000 mg | ORAL_TABLET | Freq: Four times a day (QID) | ORAL | Status: DC | PRN

## 2012-06-28 MED ORDER — IBUPROFEN 800 MG PO TABS
800.0000 mg | ORAL_TABLET | Freq: Once | ORAL | Status: AC
  Administered 2012-06-28: 800 mg via ORAL
  Filled 2012-06-28: qty 1

## 2012-06-28 MED ORDER — ACETAMINOPHEN 500 MG PO TABS
1000.0000 mg | ORAL_TABLET | Freq: Once | ORAL | Status: AC
  Administered 2012-06-28: 1000 mg via ORAL
  Filled 2012-06-28: qty 2

## 2012-06-28 NOTE — ED Notes (Signed)
Called to room x1. No answer. 

## 2012-06-28 NOTE — ED Notes (Signed)
Called to room x 2. No answer.
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
## 2012-06-28 NOTE — ED Provider Notes (Signed)
Medical screening examination/treatment/procedure(s) were performed by non-physician practitioner and as supervising physician I was immediately available for consultation/collaboration.   Dione Booze, MD 06/28/12 [phone]
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
## 2012-08-06 ENCOUNTER — Encounter (HOSPITAL_COMMUNITY): Payer: Self-pay | Admitting: *Deleted

## 2012-08-06 ENCOUNTER — Emergency Department (HOSPITAL_COMMUNITY)
Admission: EM | Admit: 2012-08-06 | Discharge: 2012-08-06 | Disposition: A | Discharge status: Home / Self Care | Payer: Self-pay | Attending: Emergency Medicine | Admitting: Emergency Medicine

## 2012-08-06 DIAGNOSIS — I10 Essential (primary) hypertension: Secondary | ICD-10-CM | POA: Insufficient documentation

## 2012-08-06 DIAGNOSIS — F17200 Nicotine dependence, unspecified, uncomplicated: Secondary | ICD-10-CM | POA: Insufficient documentation

## 2012-08-06 DIAGNOSIS — J45909 Unspecified asthma, uncomplicated: Secondary | ICD-10-CM | POA: Insufficient documentation

## 2012-08-06 DIAGNOSIS — K0889 Other specified disorders of teeth and supporting structures: Secondary | ICD-10-CM

## 2012-08-06 DIAGNOSIS — G8929 Other chronic pain: Secondary | ICD-10-CM | POA: Insufficient documentation

## 2012-08-06 DIAGNOSIS — K089 Disorder of teeth and supporting structures, unspecified: Secondary | ICD-10-CM | POA: Insufficient documentation

## 2012-08-06 MED ORDER — TRAMADOL HCL 50 MG PO TABS: 50.0000 mg | ORAL_TABLET | Freq: Four times a day (QID) | ORAL | Status: DC | PRN

## 2012-08-06 NOTE — ED Provider Notes (Signed)
Medical screening examination/treatment/procedure(s) were performed by non-physician practitioner and as supervising physician I was immediately available for consultation/collaboration.  Zanayah Shadowens, MD 08/06/12 2241 

## 2012-08-06 NOTE — ED Provider Notes (Signed)
History     CSN: [id number]  Arrival date & time 08/06/12  1719   First MD Initiated Contact with Patient 08/06/12 1730      Chief Complaint  Patient presents with  . Dental Pain    (Consider location/radiation/quality/duration/timing/severity/associated sxs/prior treatment) HPI Comments: Darlen Gledhill is a 27 y.o. Female with a 2 day history of dental pain and injury,  Reporting she has  Crumbling tooth and has lost several pieces off it this week.   She is seen by Dr. Wille Celeste who is supposed to extract this tooth and one other tooth which has been a chronic source of pain,  But she cannot have this scheduled until she receives her state income refund check.   There has been no fevers,  Chills, nausea or vomiting, also no complaint of difficulty swallowing,  Although chewing makes pain worse.  The patient has tried ibuprofen without relief of symptoms.         The history is provided by the patient.    Past Medical History  Diagnosis Date  . Hypertension   . Asthma     Past Surgical History  Procedure Laterality Date  . Dilitation and curretage    . Meth addiction      06/10/2012, No use for 2 years    No family history on file.  History  Substance Use Topics  . Smoking status: Current Every Day Smoker -- 0.50 packs/day    Types: Cigarettes  . Smokeless tobacco: Not on file  . Alcohol Use: Yes     Comment: ocassionally    OB History   Grav Para Term Preterm Abortions TAB SAB Ect Mult Living                  Review of Systems  Constitutional: Negative for fever.  HENT: Positive for dental problem. Negative for sore throat, facial swelling, neck pain and neck stiffness.   Respiratory: Negative for shortness of breath.     Allergies  Penicillins  Home Medications   Current Outpatient Rx  Name  Route  Sig  Dispense  Refill  . ibuprofen (ADVIL,MOTRIN) 200 MG tablet   Oral   Take 800 mg by mouth every 8 (eight) hours as needed for pain.         .  traMADol (ULTRAM) 50 MG tablet   Oral   Take 1 tablet (50 mg total) by mouth every 6 (six) hours as needed for pain.   20 tablet   0     BP 145/72  Pulse 92  Temp(Src) 98.1 F (36.7 C) (Oral)  Resp 18  Ht 5\' 3"  (1.6 m)  Wt 160 lb (72.576 kg)  BMI 28.35 kg/m2  SpO2 100%  LMP 07/31/2012  Physical Exam  Constitutional: She is oriented to person, place, and time. She appears well-developed and well-nourished. No distress.  HENT:  Head: Normocephalic and atraumatic. No trismus in the jaw.  Right Ear: Tympanic membrane and external ear normal.  Left Ear: Tympanic membrane and external ear normal.  Mouth/Throat: Oropharynx is clear and moist and mucous membranes are normal. No oral lesions. Abnormal dentition. No dental abscesses.    Tooth with several small fragments missing,  No gingival swelling or erythema.  Eyes: Conjunctivae are normal.  Neck: Normal range of motion. Neck supple.  Cardiovascular: Normal rate and normal heart sounds.   Pulmonary/Chest: Effort normal.  Abdominal: She exhibits no distension.  Musculoskeletal: Normal range of motion.  Lymphadenopathy:    She  has no cervical adenopathy.  Neurological: She is alert and oriented to person, place, and time.  Skin: Skin is warm and dry. No erythema.  Psychiatric: She has a normal mood and affect.    ED Course  Procedures (including critical care time)  Labs Reviewed - No data to display No results found.   1. Pain, dental       MDM  Tramadol prescribed.  Pt encouraged to get her chronic dental pain resolved asap.        Burgess Amor, PA-C 08/06/12 1805

## 2012-08-06 NOTE — ED Notes (Signed)
Dental pain. 

## 2012-08-30 ENCOUNTER — Emergency Department (HOSPITAL_COMMUNITY)
Admission: EM | Admit: 2012-08-30 | Discharge: 2012-08-30 | Disposition: A | Discharge status: Home / Self Care | Payer: Self-pay | Attending: Emergency Medicine | Admitting: Emergency Medicine

## 2012-08-30 ENCOUNTER — Encounter (HOSPITAL_COMMUNITY): Payer: Self-pay

## 2012-08-30 DIAGNOSIS — J45909 Unspecified asthma, uncomplicated: Secondary | ICD-10-CM | POA: Insufficient documentation

## 2012-08-30 DIAGNOSIS — K029 Dental caries, unspecified: Secondary | ICD-10-CM | POA: Insufficient documentation

## 2012-08-30 DIAGNOSIS — I10 Essential (primary) hypertension: Secondary | ICD-10-CM | POA: Insufficient documentation

## 2012-08-30 DIAGNOSIS — K089 Disorder of teeth and supporting structures, unspecified: Secondary | ICD-10-CM | POA: Insufficient documentation

## 2012-08-30 DIAGNOSIS — R220 Localized swelling, mass and lump, head: Secondary | ICD-10-CM | POA: Insufficient documentation

## 2012-08-30 DIAGNOSIS — F17200 Nicotine dependence, unspecified, uncomplicated: Secondary | ICD-10-CM | POA: Insufficient documentation

## 2012-08-30 DIAGNOSIS — K0889 Other specified disorders of teeth and supporting structures: Secondary | ICD-10-CM

## 2012-08-30 MED ORDER — CLINDAMYCIN HCL 150 MG PO CAPS: 150.0000 mg | ORAL_CAPSULE | Freq: Four times a day (QID) | ORAL | Status: DC

## 2012-08-30 MED ORDER — TRAMADOL HCL 50 MG PO TABS: 50.0000 mg | ORAL_TABLET | Freq: Four times a day (QID) | ORAL | Status: DC | PRN

## 2012-08-30 NOTE — ED Notes (Signed)
Pt reports dental pain for 2 hours pta, can taste infection in mouth, points to right side of mouth

## 2012-08-30 NOTE — ED Provider Notes (Signed)
History     CSN: [id number]  Arrival date & time 08/30/12  1819   First MD Initiated Contact with Patient 08/30/12 1837      Chief Complaint  Patient presents with  . Dental Pain    (Consider location/radiation/quality/duration/timing/severity/associated sxs/prior treatment) HPI Comments: Patient c/o pain to the right lower tooth and gums.  States she noticed swelling and drainage of her gums earlier today.  She denies difficulty swallowing, fever, chills, facial swelling or trismus.  States she cannot afford to see a dentist  Patient is a 27 y.o. female presenting with tooth pain. The history is provided by the patient.  Dental PainThe primary symptoms include mouth pain. Primary symptoms do not include dental injury, oral bleeding, oral lesions, headaches, fever, shortness of breath, sore throat, angioedema or cough. The symptoms began 2 to 6 hours ago. The symptoms are unchanged. The symptoms are new. The symptoms occur constantly.  Mouth pain began 2 - 6 hours ago. Mouth pain occurs constantly. Mouth pain is unchanged. Affected locations include: teeth and gum(s).  Additional symptoms include: dental sensitivity to temperature, gum swelling and gum tenderness. Additional symptoms do not include: purulent gums, trismus, jaw pain, facial swelling, trouble swallowing, pain with swallowing, ear pain and swollen glands. Medical issues include: smoking and periodontal disease.    Past Medical History  Diagnosis Date  . Hypertension   . Asthma     Past Surgical History  Procedure Laterality Date  . Dilitation and curretage    . Meth addiction      06/10/2012, No use for 2 years    No family history on file.  History  Substance Use Topics  . Smoking status: Current Every Day Smoker -- 0.50 packs/day    Types: Cigarettes  . Smokeless tobacco: Not on file  . Alcohol Use: Yes     Comment: ocassionally    OB History   Grav Para Term Preterm Abortions TAB SAB Ect Mult Living                 Review of Systems  Constitutional: Negative for fever and appetite change.  HENT: Positive for dental problem. Negative for ear pain, congestion, sore throat, facial swelling, trouble swallowing, neck pain and neck stiffness.   Eyes: Negative for pain and visual disturbance.  Respiratory: Negative for cough and shortness of breath.   Neurological: Negative for dizziness, facial asymmetry and headaches.  Hematological: Negative for adenopathy.  All other systems reviewed and are negative.    Allergies  Penicillins  Home Medications   Current Outpatient Rx  Name  Route  Sig  Dispense  Refill  . ibuprofen (ADVIL,MOTRIN) 200 MG tablet   Oral   Take 800 mg by mouth every 8 (eight) hours as needed for pain.         . traMADol (ULTRAM) 50 MG tablet   Oral   Take 1 tablet (50 mg total) by mouth every 6 (six) hours as needed for pain.   20 tablet   0     BP 141/79  Pulse 88  Temp(Src) 98.2 F (36.8 C) (Oral)  Resp 18  Ht 5\' 2"  (1.575 m)  Wt 160 lb (72.576 kg)  BMI 29.26 kg/m2  SpO2 100%  LMP 08/28/2012  Physical Exam  Nursing note and vitals reviewed. Constitutional: She is oriented to person, place, and time. She appears well-developed and well-nourished. No distress.  HENT:  Head: Normocephalic and atraumatic. No trismus in the jaw.  Right Ear: Tympanic  membrane and ear canal normal.  Left Ear: Tympanic membrane and ear canal normal.  Mouth/Throat: Uvula is midline, oropharynx is clear and moist and mucous membranes are normal. Dental caries present. No dental abscesses or edematous.    ttp of the right lower premolars and lateral incisor.  Mild erythema of the gums w/o abscess, no drainage or   Neck: Normal range of motion. Neck supple.  Cardiovascular: Normal rate, regular rhythm and normal heart sounds.   No murmur heard. Pulmonary/Chest: Effort normal and breath sounds normal.  Musculoskeletal: Normal range of motion.  Lymphadenopathy:    She  has no cervical adenopathy.  Neurological: She is alert and oriented to person, place, and time. She exhibits normal muscle tone. Coordination normal.  Skin: Skin is warm and dry.    ED Course  Procedures (including critical care time)  Labs Reviewed - No data to display No results found.      MDM    No obvious dental abscess, no facial edema or trismus.  Patient given referral for the dental clinic.   The patient appears reasonably screened and/or stabilized for discharge and I doubt any other medical condition or other [facility] requiring further screening, evaluation, or treatment in the ED at this time prior to discharge.       Olamae Ferrara L. Trisha Mangle, PA-C 08/30/12 2006

## 2012-08-30 NOTE — ED Provider Notes (Signed)
Medical screening examination/treatment/procedure(s) were performed by non-physician practitioner and as supervising physician I was immediately available for consultation/collaboration. Devoria Albe, MD, [facility]ando Gang   Ward Givens, MD 08/30/12 [phone]

## 2012-10-11 ENCOUNTER — Emergency Department (HOSPITAL_COMMUNITY)
Admission: EM | Admit: 2012-10-11 | Discharge: 2012-10-11 | Disposition: A | Discharge status: Home / Self Care | Payer: Self-pay | Attending: Emergency Medicine | Admitting: Emergency Medicine

## 2012-10-11 ENCOUNTER — Encounter (HOSPITAL_COMMUNITY): Payer: Self-pay | Admitting: *Deleted

## 2012-10-11 DIAGNOSIS — I10 Essential (primary) hypertension: Secondary | ICD-10-CM | POA: Insufficient documentation

## 2012-10-11 DIAGNOSIS — G8929 Other chronic pain: Secondary | ICD-10-CM | POA: Insufficient documentation

## 2012-10-11 DIAGNOSIS — Z880 Allergy status to penicillin: Secondary | ICD-10-CM | POA: Insufficient documentation

## 2012-10-11 DIAGNOSIS — R51 Headache: Secondary | ICD-10-CM | POA: Insufficient documentation

## 2012-10-11 DIAGNOSIS — Z79899 Other long term (current) drug therapy: Secondary | ICD-10-CM | POA: Insufficient documentation

## 2012-10-11 DIAGNOSIS — K0889 Other specified disorders of teeth and supporting structures: Secondary | ICD-10-CM

## 2012-10-11 DIAGNOSIS — J45909 Unspecified asthma, uncomplicated: Secondary | ICD-10-CM | POA: Insufficient documentation

## 2012-10-11 DIAGNOSIS — K089 Disorder of teeth and supporting structures, unspecified: Secondary | ICD-10-CM | POA: Insufficient documentation

## 2012-10-11 DIAGNOSIS — F17200 Nicotine dependence, unspecified, uncomplicated: Secondary | ICD-10-CM | POA: Insufficient documentation

## 2012-10-11 DIAGNOSIS — K029 Dental caries, unspecified: Secondary | ICD-10-CM | POA: Insufficient documentation

## 2012-10-11 MED ORDER — MELOXICAM 7.5 MG PO TABS: ORAL_TABLET | ORAL | Status: DC

## 2012-10-11 MED ORDER — TRAMADOL HCL 50 MG PO TABS: ORAL_TABLET | ORAL | Status: DC

## 2012-10-11 MED ORDER — CLINDAMYCIN HCL 150 MG PO CAPS: 150.0000 mg | ORAL_CAPSULE | Freq: Three times a day (TID) | ORAL | Status: DC

## 2012-10-11 NOTE — ED Provider Notes (Signed)
History     CSN: [id number]  Arrival date & time 10/11/12  1008   First MD Initiated Contact with Patient 10/11/12 1043      Chief Complaint  Patient presents with  . Dental Pain    (Consider location/radiation/quality/duration/timing/severity/associated sxs/prior treatment) Patient is a 27 y.o. female presenting with tooth pain. The history is provided by the patient.  Dental Pain Location:  Upper, lower and generalized Quality:  Throbbing Severity:  Severe Onset quality:  Gradual Timing:  Intermittent Progression:  Worsening Chronicity:  Chronic Context: dental caries   Context: not abscess   Relieved by:  Nothing Ineffective treatments:  NSAIDs and acetaminophen Associated symptoms: headaches   Associated symptoms: no difficulty swallowing   Risk factors: smoking   Risk factors comment:  Meth addiction   Past Medical History  Diagnosis Date  . Hypertension   . Asthma     Past Surgical History  Procedure Laterality Date  . Dilitation and curretage    . Meth addiction      06/10/2012, No use for 2 years    No family history on file.  History  Substance Use Topics  . Smoking status: Current Every Day Smoker -- 0.50 packs/day    Types: Cigarettes  . Smokeless tobacco: Not on file  . Alcohol Use: Yes     Comment: ocassionally    OB History   Grav Para Term Preterm Abortions TAB SAB Ect Mult Living                  Review of Systems  Neurological: Positive for headaches.    Allergies  Penicillins  Home Medications   Current Outpatient Rx  Name  Route  Sig  Dispense  Refill  . albuterol (PROVENTIL HFA;VENTOLIN HFA) 108 (90 BASE) MCG/ACT inhaler   Inhalation   Inhale 2 puffs into the lungs every 6 (six) hours as needed for wheezing.         . clindamycin (CLEOCIN) 150 MG capsule   Oral   Take 1 capsule (150 mg total) by mouth 3 (three) times daily.   21 capsule   0   . meloxicam (MOBIC) 7.5 MG tablet      1 po bid with food   14  tablet   0   . traMADol (ULTRAM) 50 MG tablet      1 or 2 po q6h prn pain   20 tablet   0     BP 136/94  Pulse 84  Temp(Src) 98.9 F (37.2 C) (Oral)  Resp 20  SpO2 100%  LMP 09/29/2012  Physical Exam  Nursing note and vitals reviewed. Constitutional: She is oriented to person, place, and time. She appears well-developed and well-nourished.  Non-toxic appearance.  HENT:  Head: Normocephalic.  Right Ear: Tympanic membrane and external ear normal.  Left Ear: Tympanic membrane and external ear normal.  Mouth/Throat: Dental caries present. No dental abscesses or edematous.    Eyes: EOM and lids are normal. Pupils are equal, round, and reactive to light.  Neck: Normal range of motion. Neck supple. Carotid bruit is not present.  Cardiovascular: Normal rate, regular rhythm, normal heart sounds, intact distal pulses and normal pulses.   Pulmonary/Chest: Breath sounds normal. No respiratory distress.  Abdominal: Soft. Bowel sounds are normal. There is no tenderness. There is no guarding.  Musculoskeletal: Normal range of motion.  Lymphadenopathy:       Head (right side): No submandibular adenopathy present.       Head (left  side): No submandibular adenopathy present.    She has no cervical adenopathy.  Neurological: She is alert and oriented to person, place, and time. She has normal strength. No cranial nerve deficit or sensory deficit.  Skin: Skin is warm and dry.  Psychiatric: She has a normal mood and affect. Her speech is normal.    ED Course  Procedures (including critical care time)  Labs Reviewed - No data to display No results found.   1. Toothache       MDM  I have reviewed nursing notes, vital signs, and all appropriate lab and imaging results for this patient. Pt has ongoing problem with dental caries and dental pain. The pain has become worse for the past week. OTC meds are not helping. No reported high fever. No abscess, or swelling under the tongue, or  problem with airway noted on exam. Plan - Rx for cleocin, mobic, and ultram given to the patient. Dental resources sheet given to the patient.       Kathie Dike, PA-C 10/16/12 [phone]

## 2012-10-11 NOTE — ED Notes (Signed)
Pt c/o left upper and right lower tooth pain that started a week ago,

## 2012-10-18 NOTE — ED Provider Notes (Signed)
Medical screening examination/treatment/procedure(s) were performed by non-physician practitioner and as supervising physician I was immediately available for consultation/collaboration.    Vida Roller, MD 10/18/12 [phone]

## 2012-12-05 ENCOUNTER — Encounter (HOSPITAL_COMMUNITY): Payer: Self-pay | Admitting: Emergency Medicine

## 2012-12-05 ENCOUNTER — Emergency Department (HOSPITAL_COMMUNITY)
Admission: EM | Admit: 2012-12-05 | Discharge: 2012-12-05 | Disposition: A | Discharge status: Home / Self Care | Payer: Self-pay | Attending: Emergency Medicine | Admitting: Emergency Medicine

## 2012-12-05 DIAGNOSIS — K0889 Other specified disorders of teeth and supporting structures: Secondary | ICD-10-CM

## 2012-12-05 DIAGNOSIS — I10 Essential (primary) hypertension: Secondary | ICD-10-CM | POA: Insufficient documentation

## 2012-12-05 DIAGNOSIS — Z79899 Other long term (current) drug therapy: Secondary | ICD-10-CM | POA: Insufficient documentation

## 2012-12-05 DIAGNOSIS — K089 Disorder of teeth and supporting structures, unspecified: Secondary | ICD-10-CM | POA: Insufficient documentation

## 2012-12-05 DIAGNOSIS — Z880 Allergy status to penicillin: Secondary | ICD-10-CM | POA: Insufficient documentation

## 2012-12-05 DIAGNOSIS — J45909 Unspecified asthma, uncomplicated: Secondary | ICD-10-CM | POA: Insufficient documentation

## 2012-12-05 DIAGNOSIS — F17200 Nicotine dependence, unspecified, uncomplicated: Secondary | ICD-10-CM | POA: Insufficient documentation

## 2012-12-05 MED ORDER — TRAMADOL HCL 50 MG PO TABS: 50.0000 mg | ORAL_TABLET | Freq: Four times a day (QID) | ORAL | Status: DC | PRN

## 2012-12-05 MED ORDER — CLINDAMYCIN HCL 300 MG PO CAPS: 300.0000 mg | ORAL_CAPSULE | Freq: Three times a day (TID) | ORAL | Status: DC

## 2012-12-05 NOTE — ED Provider Notes (Signed)
CSN: [id number]     Arrival date & time 12/05/12  1510 History  This chart was scribed for Laurie Skeens, MD by Bennett Scrape, ED Scribe. This patient was seen in room APFT23/APFT23 and the patient's care was started at 3:30 PM.   First MD Initiated Contact with Patient 12/05/12 1530     Chief Complaint  Patient presents with  . Dental Pain    The history is provided by the patient. No language interpreter was used.    HPI Comments: Laurie Stephens is a 27 y.o. female who presents to the Emergency Department complaining of 2 days of worsening of chronic left lower dental pain after a "large chunk" fell out yesterday. She states that the pain has been localized to the affected tooth for the past year until the chunk fell out. She states that the pain now radiates into the left ear and down the left lower jaw. She reports that last week she had an abscess that "I let go" and admits that this could be the reason why the tooth broke. She admits that she has been taking old clindamycin for the past 3 days with no improvement and has tried taking  Ibuprofen, 4 pills at a time, with no improvement. She denies any fevers or chills. She denies having a h/o DM.   Past Medical History  Diagnosis Date  . Hypertension   . Asthma    Past Surgical History  Procedure Laterality Date  . Dilitation and curretage    . Meth addiction      06/10/2012, No use for 2 years   History reviewed. No pertinent family history. History  Substance Use Topics  . Smoking status: Current Every Day Smoker -- 0.50 packs/day    Types: Cigarettes  . Smokeless tobacco: Not on file  . Alcohol Use: Yes     Comment: ocassionally   No OB history provided.   Review of Systems  Constitutional: Negative for fever and chills.  HENT: Positive for dental problem. Negative for facial swelling.     Allergies  Penicillins  Home Medications   Current Outpatient Rx  Name  Route  Sig  Dispense  Refill  . albuterol  (PROVENTIL HFA;VENTOLIN HFA) 108 (90 BASE) MCG/ACT inhaler   Inhalation   Inhale 2 puffs into the lungs every 6 (six) hours as needed for wheezing.         . clindamycin (CLEOCIN) 150 MG capsule   Oral   Take 1 capsule (150 mg total) by mouth 3 (three) times daily.   21 capsule   0   . meloxicam (MOBIC) 7.5 MG tablet      1 po bid with food   14 tablet   0   . traMADol (ULTRAM) 50 MG tablet      1 or 2 po q6h prn pain   20 tablet   0    Triage Vitals: BP 155/92  Pulse 70  Temp(Src) 98.6 F (37 C) (Oral)  Resp 16  Ht 5\' 3"  (1.6 m)  Wt 160 lb (40.981 kg)  BMI 28.35 kg/m2  SpO2 100%  LMP 11/25/2012  Physical Exam  Nursing note and vitals reviewed. Constitutional: She is oriented to person, place, and time. She appears well-developed and well-nourished. No distress.  HENT:  Head: Normocephalic and atraumatic.  Bottom left 3rd molar from the back, tender to palpation, no palpable abscess, no upper tenderness, no submandibular swelling, no trimus, Moist MM  Eyes: EOM are normal.  Neck:  Neck supple. No tracheal deviation present.  Cardiovascular: Normal rate.   Pulmonary/Chest: Effort normal. No respiratory distress.  Musculoskeletal: Normal range of motion.  Lymphadenopathy:    She has no cervical adenopathy.  Neurological: She is alert and oriented to person, place, and time.  Skin: Skin is warm and dry.  Psychiatric: She has a normal mood and affect. Her behavior is normal.    ED Course   Procedures (including critical care time)  DIAGNOSTIC STUDIES: Oxygen Saturation is 100% on room air, normal by my interpretation.    COORDINATION OF CARE: 3:36 PM-Advised pt to quit smoking. Discussed discharge plan which includes antibiotic and pain medication with pt and pt agreed to plan. Also advised pt to follow up with a dentist and pt agreed. Addressed symptoms to return for with pt.    No diagnosis found.  MDM  I personally performed the services described in  this documentation, which was scribed in my presence. The recorded information has been reviewed and is accurate. No sings of serious facial infection.  Fup discussed.  Pain meds and abx.   Laurie Skeens, MD 12/10/12 2232

## 2012-12-05 NOTE — ED Notes (Signed)
Pt c/o L lower jaw dental pain, onset this am. Pt states the tooth broke off several days ago but didn't start hurting until today. Pt also states she had an abscess of the front lower tooth that has not been treated.

## 2013-01-16 ENCOUNTER — Emergency Department (HOSPITAL_COMMUNITY)
Admission: EM | Admit: 2013-01-16 | Discharge: 2013-01-16 | Disposition: A | Discharge status: Home / Self Care | Payer: Self-pay | Attending: Emergency Medicine | Admitting: Emergency Medicine

## 2013-01-16 ENCOUNTER — Emergency Department (HOSPITAL_COMMUNITY): Payer: Self-pay

## 2013-01-16 ENCOUNTER — Encounter (HOSPITAL_COMMUNITY): Payer: Self-pay | Admitting: *Deleted

## 2013-01-16 DIAGNOSIS — J45909 Unspecified asthma, uncomplicated: Secondary | ICD-10-CM | POA: Insufficient documentation

## 2013-01-16 DIAGNOSIS — F17200 Nicotine dependence, unspecified, uncomplicated: Secondary | ICD-10-CM | POA: Insufficient documentation

## 2013-01-16 DIAGNOSIS — Z7982 Long term (current) use of aspirin: Secondary | ICD-10-CM | POA: Insufficient documentation

## 2013-01-16 DIAGNOSIS — R51 Headache: Secondary | ICD-10-CM | POA: Insufficient documentation

## 2013-01-16 DIAGNOSIS — R1110 Vomiting, unspecified: Secondary | ICD-10-CM | POA: Insufficient documentation

## 2013-01-16 DIAGNOSIS — Z880 Allergy status to penicillin: Secondary | ICD-10-CM | POA: Insufficient documentation

## 2013-01-16 DIAGNOSIS — Z79899 Other long term (current) drug therapy: Secondary | ICD-10-CM | POA: Insufficient documentation

## 2013-01-16 DIAGNOSIS — I10 Essential (primary) hypertension: Secondary | ICD-10-CM | POA: Insufficient documentation

## 2013-01-16 MED ORDER — DIPHENHYDRAMINE HCL 50 MG/ML IJ SOLN
25.0000 mg | Freq: Once | INTRAMUSCULAR | Status: AC
  Administered 2013-01-16: 25 mg via INTRAMUSCULAR
  Filled 2013-01-16: qty 1

## 2013-01-16 MED ORDER — METOCLOPRAMIDE HCL 5 MG/ML IJ SOLN
10.0000 mg | Freq: Once | INTRAMUSCULAR | Status: AC
  Administered 2013-01-16: 10 mg via INTRAMUSCULAR
  Filled 2013-01-16: qty 2

## 2013-01-16 MED ORDER — KETOROLAC TROMETHAMINE 60 MG/2ML IM SOLN
60.0000 mg | Freq: Once | INTRAMUSCULAR | Status: AC
  Administered 2013-01-16: 60 mg via INTRAMUSCULAR
  Filled 2013-01-16: qty 2

## 2013-01-16 NOTE — ED Notes (Signed)
This nurse returned to nursing station after being in another patient's room. Secretary Audry Pili. Notified this nurse that patient had left with family. Patient stated she was still feeling the same as when she arrived to ED, but that she did not want to wait due to her kids needing to be home. Patient stated to [facility] that she would return to ED tomorrow.

## 2013-01-16 NOTE — ED Provider Notes (Signed)
CSN: [id number]     Arrival date & time 01/16/13  2044 History  This chart was scribed for Glynn Octave, MD by Carl Best, ED Scribe. This patient was seen in room APA03/APA03 and the patient's care was started at 10:30 PM.     Chief Complaint  Patient presents with  . Headache    The history is provided by the patient. No language interpreter was used.   HPI Comments: Laurie Stephens is a 27 y.o. female who presents to the Emergency Department complaining of headache located in the occipital region that radiates to her back and started four days ago.  She states that she woke up with the pain and it has gradually gotten worse.  She reports associated emesis that occurred this morning while she was in the shower.  Patient states that the pain is aggravated by light and when she lies down.   She states that she takes 4 Ibuprofen four times a day and 2 aspirin with no relief. She denies any recent falls, head trauma, or strenuous activity.  She denies any history of frequent headaches.  Patient denies abdominal pain, chest pain, fever, lightheadedness, and dizziness. 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  LNMP occurred 12/25/2012.   Does not have a PCP.    Past Medical History  Diagnosis Date  . Hypertension   . Asthma    Past Surgical History  Procedure Laterality Date  . Dilitation and curretage    . Meth addiction      06/10/2012, No use for 2 years   History reviewed. No pertinent family history. History  Substance Use Topics  . Smoking status: Current Every Day Smoker -- 0.50 packs/day    Types: Cigarettes  . Smokeless tobacco: Not on file  . Alcohol Use: Yes     Comment: ocassionally   OB History   Grav Para Term Preterm Abortions TAB SAB Ect Mult Living                 Review of Systems A complete 10 system review of systems was obtained and all systems are negative except as noted in the HPI and PMH.    Allergies  Penicillins- confirmed by patient.   Home Medications   Current Outpatient Rx  Name  Route  Sig  Dispense  Refill  . albuterol (PROVENTIL HFA;VENTOLIN HFA) 108 (90 BASE) MCG/ACT inhaler   Inhalation   Inhale 2 puffs into the lungs every 6 (six) hours as needed for wheezing.         Marland Kitchen aspirin 325 MG tablet   Oral   Take 650 mg by mouth 3 (three) times daily.         Marland Kitchen ibuprofen (ADVIL,MOTRIN) 200 MG tablet   Oral   Take 800 mg by mouth 4 (four) times daily as needed for pain.          Triage Vitals: BP 153/94  Pulse 89  Temp(Src) 98 F (36.7 C) (Oral)  Resp 20  Ht 5\' 3"  (1.6 m)  Wt 160 lb (72.576 kg)  BMI 28.35 kg/m2  SpO2 100%  LMP 12/25/2012 Physical Exam  Nursing note and vitals reviewed. Constitutional: She is oriented to person, place, and time. She appears well-developed and well-nourished.  4 young children in the room, significant other in room  HENT:  Head: Normocephalic and atraumatic.  Right Ear: External ear normal.  Left Ear: External ear normal.  Eyes: Conjunctivae and  EOM are normal. Pupils are equal, round, and reactive to light.  No papulaedma   Neck: Normal range of motion and phonation normal. Neck supple.  No meningismus   Cardiovascular: Normal rate, regular rhythm, normal heart sounds and intact distal pulses.   Pulmonary/Chest: Effort normal and breath sounds normal. She exhibits no bony tenderness.  Abdominal: Soft. Normal appearance. There is no tenderness.  Musculoskeletal: Normal range of motion.  Neurological: She is alert and oriented to person, place, and time. She has normal strength. No cranial nerve deficit or sensory deficit. She exhibits normal muscle tone. Coordination normal.  No ataxia on finger to nose, 5/5 strength throughout,  CN 2-12 intact, no ataxia on finger to nose, no nystagmus, 5/5 strength throughout, no pronator drift, Romberg negative, normal gait.   Skin: Skin is warm, dry and intact.   Psychiatric: She has a normal mood and affect. Her behavior is normal. Judgment and thought content normal.    ED Course  Procedures (including critical care time)  DIAGNOSTIC STUDIES: Oxygen Saturation is 100% on room air, normal by my interpretation.    COORDINATION OF CARE: 11:47 PM- Discussed prescribing pain medication for headache and obtaining a CT scan with patient at bedside and patient agreed.   Labs Review Labs Reviewed - No data to display Imaging Review No results found.  MDM   1. Headache    3 history of gradual onset of posterior headache that is progressively worsening. Denies thunderclap onset. No focal weakness, numbness or tingling.  Neurological exam is nonfocal. As this patient has not had similar headaches previously planned to obtain CT scan.  Patient left the ED without informing staff prior to receiving treatment or CT scan.  I personally performed the services described in this documentation, which was scribed in my presence. The recorded information has been reviewed and is accurate.    Glynn Octave, MD 01/16/13 [phone]

## 2013-01-16 NOTE — ED Notes (Signed)
Patient would like something for pain. RN made aware. 

## 2013-01-16 NOTE — ED Notes (Addendum)
Post headache, into post neck N/V

## 2013-02-19 ENCOUNTER — Emergency Department: Payer: Self-pay | Admitting: Emergency Medicine

## 2013-02-19 LAB — TROPONIN I: Troponin-I: <0.02 ng/mL

## 2013-02-19 LAB — BASIC METABOLIC PANEL
BUN: 20 mg/dL — ABNORMAL HIGH (ref 7–18)
Calcium, Total: 9.4 mg/dL (ref 8.5–10.1)
Co2: 25 mmol/L (ref 21–32)
EGFR (African American): >60
EGFR (Non-African Amer.): >60
Glucose: 117 mg/dL — ABNORMAL HIGH (ref 65–99)
Potassium: 3.6 mmol/L (ref 3.5–5.1)

## 2013-02-19 LAB — CBC
HCT: 38.4 % (ref 35.0–47.0)
HGB: 13.6 g/dL (ref 12.0–16.0)
MCV: 83 fL (ref 80–100)
WBC: 10.2 10*3/uL (ref 3.6–11.0)

## 2013-02-19 LAB — SALICYLATE LEVEL: Salicylates, Serum: 3 mg/dL — ABNORMAL HIGH

## 2013-02-19 LAB — ACETAMINOPHEN LEVEL: Acetaminophen: <2 ug/mL

## 2013-02-20 LAB — DRUG SCREEN, URINE
Amphetamines, Ur Screen: NEGATIVE (ref ?–1000)
Cocaine Metabolite,Ur ~~LOC~~: NEGATIVE (ref ?–300)
Opiate, Ur Screen: NEGATIVE (ref ?–300)
Phencyclidine (PCP) Ur S: NEGATIVE (ref ?–25)
Tricyclic, Ur Screen: NEGATIVE (ref ?–1000)

## 2013-02-27 ENCOUNTER — Emergency Department: Payer: Self-pay | Admitting: Emergency Medicine

## 2014-04-25 ENCOUNTER — Emergency Department: Payer: Self-pay | Admitting: Emergency Medicine

## 2014-05-17 ENCOUNTER — Emergency Department: Payer: Self-pay | Admitting: Emergency Medicine

## 2014-05-17 LAB — COMPREHENSIVE METABOLIC PANEL
ALK PHOS: 91 U/L
ALT: 23 U/L
Albumin: 3.6 g/dL (ref 3.4–5.0)
Anion Gap: 8 (ref 7–16)
BILIRUBIN TOTAL: 0.5 mg/dL (ref 0.2–1.0)
BUN: 11 mg/dL (ref 7–18)
CHLORIDE: 106 mmol/L (ref 98–107)
CO2: 25 mmol/L (ref 21–32)
CREATININE: 0.77 mg/dL (ref 0.60–1.30)
Calcium, Total: 9 mg/dL (ref 8.5–10.1)
EGFR (African American): >60
EGFR (Non-African Amer.): >60
Glucose: 84 mg/dL (ref 65–99)
OSMOLALITY: 276 (ref 275–301)
Potassium: 3.7 mmol/L (ref 3.5–5.1)
SGOT(AST): 18 U/L (ref 15–37)
SODIUM: 139 mmol/L (ref 136–145)
TOTAL PROTEIN: 7.4 g/dL (ref 6.4–8.2)

## 2014-05-17 LAB — URINALYSIS, COMPLETE
BACTERIA: NONE SEEN
BILIRUBIN, UR: NEGATIVE
BLOOD: NEGATIVE
GLUCOSE, UR: NEGATIVE mg/dL (ref 0–75)
Leukocyte Esterase: NEGATIVE
Nitrite: NEGATIVE
PH: 6 (ref 4.5–8.0)
Protein: 30
RBC,UR: <1 /HPF (ref 0–5)
SPECIFIC GRAVITY: 1.02 (ref 1.003–1.030)
WBC UR: 3 /HPF (ref 0–5)

## 2014-05-17 LAB — CBC
HCT: 42.4 % (ref 35.0–47.0)
HGB: 14.1 g/dL (ref 12.0–16.0)
MCH: 29.7 pg (ref 26.0–34.0)
MCHC: 33.1 g/dL (ref 32.0–36.0)
MCV: 90 fL (ref 80–100)
PLATELETS: 377 10*3/uL (ref 150–440)
RBC: 4.73 10*6/uL (ref 3.80–5.20)
RDW: 14.8 % — ABNORMAL HIGH (ref 11.5–14.5)
WBC: 15.2 10*3/uL — ABNORMAL HIGH (ref 3.6–11.0)

## 2014-05-17 LAB — WET PREP, GENITAL

## 2014-05-17 LAB — HCG, QUANTITATIVE, PREGNANCY: Beta Hcg, Quant.: 53215 m[IU]/mL — ABNORMAL HIGH

## 2014-05-17 LAB — LIPASE, BLOOD: Lipase: 145 U/L (ref 73–393)

## 2014-05-18 LAB — GC/CHLAMYDIA PROBE AMP

## 2014-06-16 ENCOUNTER — Emergency Department: Payer: Self-pay | Admitting: Emergency Medicine

## 2014-06-21 ENCOUNTER — Emergency Department: Payer: Self-pay | Admitting: Emergency Medicine

## 2014-06-23 ENCOUNTER — Emergency Department: Payer: Self-pay | Admitting: Emergency Medicine

## 2014-12-25 ENCOUNTER — Encounter: Payer: Self-pay | Admitting: Emergency Medicine

## 2014-12-25 ENCOUNTER — Emergency Department
Admission: EM | Admit: 2014-12-25 | Discharge: 2014-12-25 | Disposition: A | Discharge status: Home / Self Care | Payer: Medicaid Other | Attending: Emergency Medicine | Admitting: Emergency Medicine

## 2014-12-25 DIAGNOSIS — Z3202 Encounter for pregnancy test, result negative: Secondary | ICD-10-CM | POA: Diagnosis not present

## 2014-12-25 DIAGNOSIS — R112 Nausea with vomiting, unspecified: Secondary | ICD-10-CM | POA: Insufficient documentation

## 2014-12-25 DIAGNOSIS — M549 Dorsalgia, unspecified: Secondary | ICD-10-CM

## 2014-12-25 DIAGNOSIS — Z720 Tobacco use: Secondary | ICD-10-CM | POA: Diagnosis not present

## 2014-12-25 DIAGNOSIS — M546 Pain in thoracic spine: Secondary | ICD-10-CM | POA: Diagnosis not present

## 2014-12-25 DIAGNOSIS — R350 Frequency of micturition: Secondary | ICD-10-CM | POA: Insufficient documentation

## 2014-12-25 DIAGNOSIS — R824 Acetonuria: Secondary | ICD-10-CM

## 2014-12-25 DIAGNOSIS — Z7982 Long term (current) use of aspirin: Secondary | ICD-10-CM | POA: Diagnosis not present

## 2014-12-25 DIAGNOSIS — Z880 Allergy status to penicillin: Secondary | ICD-10-CM | POA: Insufficient documentation

## 2014-12-25 DIAGNOSIS — I10 Essential (primary) hypertension: Secondary | ICD-10-CM | POA: Diagnosis not present

## 2014-12-25 DIAGNOSIS — R109 Unspecified abdominal pain: Secondary | ICD-10-CM | POA: Diagnosis present

## 2014-12-25 LAB — URINALYSIS COMPLETE WITH MICROSCOPIC (ARMC ONLY)
Bilirubin Urine: NEGATIVE
GLUCOSE, UA: NEGATIVE mg/dL
Hgb urine dipstick: NEGATIVE
Nitrite: NEGATIVE
PH: 6 (ref 5.0–8.0)
Protein, ur: NEGATIVE mg/dL
SPECIFIC GRAVITY, URINE: 1.024 (ref 1.005–1.030)

## 2014-12-25 LAB — POCT PREGNANCY, URINE: PREG TEST UR: NEGATIVE

## 2014-12-25 LAB — GLUCOSE, CAPILLARY: Glucose-Capillary: 111 mg/dL — ABNORMAL HIGH (ref 65–99)

## 2014-12-25 MED ORDER — ONDANSETRON HCL 4 MG/2ML IJ SOLN
INTRAMUSCULAR | Status: AC
  Filled 2014-12-25: qty 2

## 2014-12-25 MED ORDER — SODIUM CHLORIDE 0.9 % IV BOLUS (SEPSIS): 1000.0000 mL | Freq: Once | INTRAVENOUS | Status: DC

## 2014-12-25 MED ORDER — ONDANSETRON 4 MG PO TBDP: 4.0000 mg | ORAL_TABLET | Freq: Three times a day (TID) | ORAL | Status: DC | PRN

## 2014-12-25 MED ORDER — ONDANSETRON HCL 4 MG/2ML IJ SOLN: 4.0000 mg | Freq: Once | INTRAMUSCULAR | Status: DC

## 2014-12-25 MED ORDER — ETODOLAC 400 MG PO TABS: 400.0000 mg | ORAL_TABLET | Freq: Two times a day (BID) | ORAL | Status: DC

## 2014-12-25 NOTE — ED Notes (Addendum)
Pt refused IV, NS and Zofran. NP notified. Pt states she has somewhere to be in 1 hour and denies wanting to wait on fluids to finish. BG checked per pts request.

## 2014-12-25 NOTE — ED Provider Notes (Signed)
[facility] Emergency Department Provider Note  ____________________________________________  Time seen: Approximately 3:29 PM  I have reviewed the triage vital signs and the nursing notes.   HISTORY  Chief Complaint Flank Pain   HPI Laurie Stephens is a 29 y.o. female is here with complaint of right low back pain for the past week. She denies any history of injury. She denies any previous back problems. She states that she has vomited multiple times yesterday and has had some urinary frequency and odor. She denies any fever. She states she was recently treated for urinary tract infection. She continues to have some nausea today. She has continued drinking a considerable amount of caffeine today. She denies any history of fever during this time.Currently her pain is 8 out of 10.   Past Medical History  Diagnosis Date  . Hypertension   . Asthma     There are no active problems to display for this patient.   Past Surgical History  Procedure Laterality Date  . Dilitation and curretage    . Meth addiction      06/10/2012, No use for 2 years    Current Outpatient Rx  Name  Route  Sig  Dispense  Refill  . albuterol (PROVENTIL HFA;VENTOLIN HFA) 108 (90 BASE) MCG/ACT inhaler   Inhalation   Inhale 2 puffs into the lungs every 6 (six) hours as needed for wheezing.         Marland Kitchen aspirin 325 MG tablet   Oral   Take 650 mg by mouth 3 (three) times daily.         Marland Kitchen etodolac (LODINE) 400 MG tablet   Oral   Take 1 tablet (400 mg total) by mouth 2 (two) times daily.   20 tablet   0   . ibuprofen (ADVIL,MOTRIN) 200 MG tablet   Oral   Take 800 mg by mouth 4 (four) times daily as needed for pain.         Marland Kitchen ondansetron (ZOFRAN ODT) 4 MG disintegrating tablet   Oral   Take 1 tablet (4 mg total) by mouth every 8 (eight) hours as needed for nausea or vomiting.   15 tablet   0     Allergies Penicillins  No family history on file.  Social  History Social History  Substance Use Topics  . Smoking status: Current Every Day Smoker -- 0.50 packs/day    Types: Cigarettes  . Smokeless tobacco: Never Used  . Alcohol Use: No     Comment: ocassionally    Review of Systems Constitutional: No fever/chills Eyes: No visual changes. ENT: No sore throat. Cardiovascular: Denies chest pain. Respiratory: Denies shortness of breath. Gastrointestinal: No abdominal pain.  Positive nausea, positive vomiting.  No diarrhea.  No constipation. Genitourinary: Positive for frequency Musculoskeletal: Right sided  back pain. Skin: Negative for rash. Neurological: Negative for headaches, focal weakness or numbness.  10-point ROS otherwise negative.  ____________________________________________   PHYSICAL EXAM:  VITAL SIGNS: ED Triage Vitals  Enc Vitals Group     BP 12/25/14 1327 142/78 mmHg     Pulse Rate 12/25/14 1327 110     Resp --      Temp 12/25/14 1327 98.2 F (36.8 C)     Temp Source 12/25/14 1327 Oral     SpO2 12/25/14 1327 99 %     Weight 12/25/14 1327 160 lb (72.576 kg)     Height 12/25/14 1327  (1.575 m)     Head Cir --  Peak Flow --      Pain Score 12/25/14 1332 8     Pain Loc --      Pain Edu? --      Excl. in GC? --     Constitutional: Alert and oriented. Well appearing and in no acute distress. Eyes: Conjunctivae are normal. PERRL. EOMI. Head: Atraumatic. Nose: No congestion/rhinnorhea. Neck: No stridor.   Cardiovascular: Normal rate, regular rhythm. Grossly normal heart sounds.  Good peripheral circulation. Respiratory: Normal respiratory effort.  No retractions. Lungs CTAB. Gastrointestinal: Soft and nontender. No distention. Musculoskeletal: Back no gross deformity was noted on exam. Range of motion is unrestricted. There is tenderness on palpation of the lateral right paravertebral muscles but no tenderness on palpation of the spine. Straight leg raises were negative No lower extremity tenderness  nor edema.  No joint effusions. Neurologic:  Normal speech and language. No gross focal neurologic deficits are appreciated. No gait instability. Reflexes are 2+ bilaterally Skin:  Skin is warm, dry and intact. No rash noted. Psychiatric: Mood and affect are normal. Speech and behavior are normal.  ____________________________________________   LABS (all labs ordered are listed, but only abnormal results are displayed)  Labs Reviewed  URINALYSIS COMPLETEWITH MICROSCOPIC ([facility] ONLY) - Abnormal; Notable for the following:    Color, Urine YELLOW (*)    APPearance HAZY (*)    Ketones, ur TRACE (*)    Leukocytes, UA TRACE (*)    Bacteria, UA RARE (*)    Squamous Epithelial / LPF TOO NUMEROUS TO COUNT (*)    All other components within normal limits  GLUCOSE, CAPILLARY - Abnormal; Notable for the following:    Glucose-Capillary 111 (*)    All other components within normal limits  POC URINE PREG, ED  POCT PREGNANCY, URINE    PROCEDURES  Procedure(s) performed: None  Critical Care performed: No  ____________________________________________   INITIAL IMPRESSION / ASSESSMENT AND PLAN / ED COURSE  Pertinent labs & imaging results that were available during my care of the patient were reviewed by me and considered in my medical decision making (see chart for details).  Patient states that she has not had time to have an IV is she needs to get her car picked up by a certain time. She is going to increase water and decrease her consumption of caffeine. She therefore is refusing IV fluids and Zofran. She is aware that she may return if any significant changes in her situation. ____________________________________________   FINAL CLINICAL IMPRESSION(S) / ED DIAGNOSES  Final diagnoses:  Urine ketone  Non-intractable vomiting with nausea, vomiting of unspecified type  Mid-back pain, acute      Tommi Rumps, PA-C 12/25/14 1651  Myrna Blazer, MD 12/26/14 [phone]

## 2014-12-25 NOTE — ED Notes (Signed)
Pt reports pain right flank pain that goes to right lower back for past week Denies fever. Vomiting multiple times yesterday. Pt also reports urinary frequency and odor.. Denies burning.

## 2015-02-09 ENCOUNTER — Emergency Department
Admission: EM | Admit: 2015-02-09 | Discharge: 2015-02-09 | Disposition: A | Discharge status: Home / Self Care | Payer: Medicaid Other | Attending: Emergency Medicine | Admitting: Emergency Medicine

## 2015-02-09 ENCOUNTER — Encounter: Payer: Self-pay | Admitting: Emergency Medicine

## 2015-02-09 DIAGNOSIS — Z880 Allergy status to penicillin: Secondary | ICD-10-CM | POA: Insufficient documentation

## 2015-02-09 DIAGNOSIS — Z720 Tobacco use: Secondary | ICD-10-CM | POA: Diagnosis not present

## 2015-02-09 DIAGNOSIS — Z3202 Encounter for pregnancy test, result negative: Secondary | ICD-10-CM | POA: Diagnosis not present

## 2015-02-09 DIAGNOSIS — N730 Acute parametritis and pelvic cellulitis: Secondary | ICD-10-CM | POA: Diagnosis not present

## 2015-02-09 DIAGNOSIS — R1032 Left lower quadrant pain: Secondary | ICD-10-CM | POA: Diagnosis present

## 2015-02-09 DIAGNOSIS — I10 Essential (primary) hypertension: Secondary | ICD-10-CM | POA: Diagnosis not present

## 2015-02-09 LAB — URINALYSIS COMPLETE WITH MICROSCOPIC (ARMC ONLY)
Bilirubin Urine: NEGATIVE
GLUCOSE, UA: NEGATIVE mg/dL
HGB URINE DIPSTICK: NEGATIVE
Ketones, ur: NEGATIVE mg/dL
NITRITE: NEGATIVE
PROTEIN: NEGATIVE mg/dL
Specific Gravity, Urine: 1.016 (ref 1.005–1.030)
pH: 7 (ref 5.0–8.0)

## 2015-02-09 LAB — CHLAMYDIA/NGC RT PCR (ARMC ONLY)
Chlamydia Tr: NOT DETECTED
N GONORRHOEAE: NOT DETECTED

## 2015-02-09 LAB — COMPREHENSIVE METABOLIC PANEL
ALBUMIN: 3.8 g/dL (ref 3.5–5.0)
ALK PHOS: 78 U/L (ref 38–126)
ALT: 19 U/L (ref 14–54)
AST: 19 U/L (ref 15–41)
Anion gap: 7 (ref 5–15)
BILIRUBIN TOTAL: 0.2 mg/dL — AB (ref 0.3–1.2)
BUN: 13 mg/dL (ref 6–20)
CALCIUM: 9.3 mg/dL (ref 8.9–10.3)
CO2: 27 mmol/L (ref 22–32)
Chloride: 109 mmol/L (ref 101–111)
Creatinine, Ser: 0.79 mg/dL (ref 0.44–1.00)
GFR calc Af Amer: >60 mL/min (ref >60)
GFR calc non Af Amer: >60 mL/min (ref >60)
GLUCOSE: 103 mg/dL — AB (ref 65–99)
Potassium: 3.8 mmol/L (ref 3.5–5.1)
Sodium: 143 mmol/L (ref 135–145)
TOTAL PROTEIN: 6.8 g/dL (ref 6.5–8.1)

## 2015-02-09 LAB — WET PREP, GENITAL
Clue Cells Wet Prep HPF POC: NONE SEEN
Trich, Wet Prep: NONE SEEN
YEAST WET PREP: NONE SEEN

## 2015-02-09 LAB — CBC
HCT: 39 % (ref 35.0–47.0)
Hemoglobin: 12.7 g/dL (ref 12.0–16.0)
MCH: 27.4 pg (ref 26.0–34.0)
MCHC: 32.4 g/dL (ref 32.0–36.0)
MCV: 84.4 fL (ref 80.0–100.0)
Platelets: 396 10*3/uL (ref 150–440)
RBC: 4.63 MIL/uL (ref 3.80–5.20)
RDW: 14.9 % — AB (ref 11.5–14.5)
WBC: 13.2 10*3/uL — ABNORMAL HIGH (ref 3.6–11.0)

## 2015-02-09 LAB — POCT PREGNANCY, URINE: PREG TEST UR: NEGATIVE

## 2015-02-09 LAB — LIPASE, BLOOD: Lipase: 66 U/L — ABNORMAL HIGH (ref 22–51)

## 2015-02-09 MED ORDER — AZITHROMYCIN 250 MG PO TABS
ORAL_TABLET | ORAL | Status: AC
  Administered 2015-02-09: 1000 mg via ORAL
  Filled 2015-02-09: qty 4

## 2015-02-09 MED ORDER — METRONIDAZOLE 500 MG PO TABS: 500.0000 mg | ORAL_TABLET | Freq: Two times a day (BID) | ORAL | Status: DC

## 2015-02-09 MED ORDER — LIDOCAINE HCL (PF) 1 % IJ SOLN
0.9000 mL | Freq: Once | INTRAMUSCULAR | Status: AC
Start: 2015-02-09 — End: 2015-02-09
  Administered 2015-02-09: 0.9 mL

## 2015-02-09 MED ORDER — CEFTRIAXONE SODIUM 250 MG IJ SOLR
250.0000 mg | INTRAMUSCULAR | Status: DC
  Administered 2015-02-09: 250 mg via INTRAMUSCULAR

## 2015-02-09 MED ORDER — OXYCODONE-ACETAMINOPHEN 5-325 MG PO TABS: 1.0000 | ORAL_TABLET | Freq: Four times a day (QID) | ORAL | Status: DC | PRN

## 2015-02-09 MED ORDER — LIDOCAINE HCL (PF) 1 % IJ SOLN
INTRAMUSCULAR | Status: AC
  Administered 2015-02-09: 0.9 mL
  Filled 2015-02-09: qty 5

## 2015-02-09 MED ORDER — CEFTRIAXONE SODIUM 250 MG IJ SOLR
INTRAMUSCULAR | Status: AC
  Administered 2015-02-09: 250 mg via INTRAMUSCULAR
  Filled 2015-02-09: qty 250

## 2015-02-09 MED ORDER — AZITHROMYCIN 250 MG PO TABS
1000.0000 mg | ORAL_TABLET | Freq: Once | ORAL | Status: AC
  Administered 2015-02-09: 1000 mg via ORAL

## 2015-02-09 MED ORDER — OXYCODONE-ACETAMINOPHEN 5-325 MG PO TABS
2.0000 | ORAL_TABLET | Freq: Once | ORAL | Status: AC
  Administered 2015-02-09: 2 via ORAL
  Filled 2015-02-09: qty 2

## 2015-02-09 MED ORDER — DOXYCYCLINE HYCLATE 100 MG PO CAPS: 100.0000 mg | ORAL_CAPSULE | Freq: Two times a day (BID) | ORAL | Status: DC

## 2015-02-09 NOTE — ED Provider Notes (Addendum)
[facility] Emergency Department Provider Note     Time seen: ----------------------------------------- 9:41 PM on 02/09/2015 -----------------------------------------    I have reviewed the triage vital signs and the nursing notes.   HISTORY  Chief Complaint Abdominal Pain    HPI Laurie Stephens is a 29 y.o. female who presents ER for left-sided lower abdominal pain since this morning. Patient states she's had a history of same with UTI and also ovarian cyst. She states she's had fever and chills, no vomiting or back pain. She's not had any vaginal bleeding. Patient states she's had been hospitalized for this before.   Past Medical History  Diagnosis Date  . Hypertension   . Asthma     There are no active problems to display for this patient.   Past Surgical History  Procedure Laterality Date  . Dilitation and curretage    . Meth addiction      06/10/2012, No use for 2 years    Allergies Penicillins  Social History Social History  Substance Use Topics  . Smoking status: Current Every Day Smoker -- 0.50 packs/day    Types: Cigarettes  . Smokeless tobacco: Never Used  . Alcohol Use: No     Comment: ocassionally    Review of Systems Constitutional: Negative for fever. Eyes: Negative for visual changes. ENT: Negative for sore throat. Cardiovascular: Negative for chest pain. Respiratory: Negative for shortness of breath. Gastrointestinal: Positive for left-sided abdominal pain Genitourinary: Negative for dysuria. Musculoskeletal: Negative for back pain. Skin: Negative for rash. Neurological: Negative for headaches, focal weakness or numbness.  10-point ROS otherwise negative.  ____________________________________________   PHYSICAL EXAM:  VITAL SIGNS: ED Triage Vitals  Enc Vitals Group     BP 02/09/15 2043 184/84 mmHg     Pulse Rate 02/09/15 2043 72     Resp 02/09/15 2043 20     Temp 02/09/15 2043 98.2 F (36.8 C)   Temp src --      SpO2 02/09/15 2043 99 %     Weight 02/09/15 2043 174 lb (78.926 kg)     Height 02/09/15 2043  (1.6 m)     Head Cir --      Peak Flow --      Pain Score 02/09/15 2042 10     Pain Loc --      Pain Edu? --      Excl. in GC? --     Constitutional: Alert and oriented. Well appearing and in no distress. Eyes: Conjunctivae are normal. PERRL. Normal extraocular movements. ENT   Head: Normocephalic and atraumatic.   Nose: No congestion/rhinnorhea.   Mouth/Throat: Mucous membranes are moist.   Neck: No stridor. Cardiovascular: Normal rate, regular rhythm. Normal and symmetric distal pulses are present in all extremities. No murmurs, rubs, or gallops. Respiratory: Normal respiratory effort without tachypnea nor retractions. Breath sounds are clear and equal bilaterally. No wheezes/rales/rhonchi. Gastrointestinal: Left lower quadrant tenderness, no rebound or guarding. Normal bowel sounds. Genitourinary: Copious vaginal discharge or cervical motion tenderness. There is no adnexal tenderness or fullness. Musculoskeletal: Nontender with normal range of motion in all extremities. No joint effusions.  No lower extremity tenderness nor edema. Neurologic:  Normal speech and language. No gross focal neurologic deficits are appreciated. Speech is normal. No gait instability. Skin:  Skin is warm, dry and intact. No rash noted. Psychiatric: Mood and affect are normal. Speech and behavior are normal. Patient exhibits appropriate insight and judgment.  ____________________________________________  ED COURSE:  Pertinent labs &  imaging results that were available during my care of the patient were reviewed by me and considered in my medical decision making (see chart for details). We'll obtain basic labs and pelvic examination. ____________________________________________    LABS (pertinent positives/negatives)  Labs Reviewed  WET PREP, GENITAL - Abnormal; Notable for  the following:    WBC, Wet Prep HPF POC MODERATE (*)    All other components within normal limits  LIPASE, BLOOD - Abnormal; Notable for the following:    Lipase 66 (*)    All other components within normal limits  COMPREHENSIVE METABOLIC PANEL - Abnormal; Notable for the following:    Glucose, Bld 103 (*)    Total Bilirubin 0.2 (*)    All other components within normal limits  CBC - Abnormal; Notable for the following:    WBC 13.2 (*)    RDW 14.9 (*)    All other components within normal limits  URINALYSIS COMPLETEWITH MICROSCOPIC ([facility] ONLY) - Abnormal; Notable for the following:    Color, Urine YELLOW (*)    APPearance CLOUDY (*)    Leukocytes, UA 2+ (*)    Bacteria, UA RARE (*)    Squamous Epithelial / LPF 6-30 (*)    All other components within normal limits  CHLAMYDIA/NGC RT PCR ([facility] ONLY)  POC URINE PREG, ED  POCT PREGNANCY, URINE    ____________________________________________  FINAL ASSESSMENT AND PLAN  Pelvic pain, pelvic inflammatory disease  Plan: Patient with labs and imaging as dictated above. Patient be given Rocephin and Zithromax here to cover for gonorrhea and chlamydia. She is also be discharged with doxycycline and Flagyl and Percocet. Sexual partner is encouraged to be treated as well. She stable for discharge   Emily Filbert, MD   Emily Filbert, MD 02/09/15 0981  Emily Filbert, MD 02/09/15 2212

## 2015-02-09 NOTE — ED Notes (Signed)
Patient ambulatory to triage with steady gait, without difficulty or distress noted; pt reports left lower abd pain with no accomp symptoms since this morning

## 2015-02-09 NOTE — Discharge Instructions (Signed)
Pelvic Inflammatory Disease °Pelvic inflammatory disease (PID) refers to an infection in some or all of the female organs. The infection can be in the uterus, ovaries, fallopian tubes, or the surrounding tissues in the pelvis. PID can cause abdominal or pelvic pain that comes on suddenly (acute pelvic pain). PID is a serious infection because it can lead to lasting (chronic) pelvic pain or the inability to have children (infertile).  °CAUSES  °The infection is often caused by the normal bacteria found in the vaginal tissues. PID may also be caused by an infection that is spread during sexual contact. PID can also occur following:  °· The birth of a baby.   °· A miscarriage.   °· An abortion.   °· Major pelvic surgery.   °· The use of an intrauterine device (IUD).   °· A sexual assault.   °RISK FACTORS °Certain factors can put a person at higher risk for PID, such as: °· Being younger than 25 years. °· Being sexually active at a young age. °· Using nonbarrier contraception. °· Having multiple sexual partners. °· Having sex with someone who has symptoms of a genital infection. °· Using oral contraception. °Other times, certain behaviors can increase the possibility of getting PID, such as: °· Having sex during your period. °· Using a vaginal douche. °· Having an intrauterine device (IUD) in place. °SYMPTOMS  °· Abdominal or pelvic pain.   °· Fever.   °· Chills.   °· Abnormal vaginal discharge. °· Abnormal uterine bleeding.   °· Unusual pain shortly after finishing your period. °DIAGNOSIS  °Your caregiver will choose some of the following methods to make a diagnosis, such as:  °· Performing a physical exam and history. A pelvic exam typically reveals a very tender uterus and surrounding pelvis.   °· Ordering laboratory tests including a pregnancy test, blood tests, and urine test.  °· Ordering cultures of the vagina and cervix to check for a sexually transmitted infection (STI). °· Performing an ultrasound.    °· Performing a laparoscopic procedure to look inside the pelvis.   °TREATMENT  °· Antibiotic medicines may be prescribed and taken by mouth.   °· Sexual partners may be treated when the infection is caused by a sexually transmitted disease (STD).   °· Hospitalization may be needed to give antibiotics intravenously. °· Surgery may be needed, but this is rare. °It may take weeks until you are completely well. If you are diagnosed with PID, you should also be checked for human immunodeficiency virus (HIV).   °HOME CARE INSTRUCTIONS  °· If given, take your antibiotics as directed. Finish the medicine even if you start to feel better.   °· Only take over-the-counter or prescription medicines for pain, discomfort, or fever as directed by your caregiver.   °· Do not have sexual intercourse until treatment is completed or as directed by your caregiver. If PID is confirmed, your recent sexual partner(s) will need treatment.   °· Keep your follow-up appointments. °SEEK MEDICAL CARE IF:  °· You have increased or abnormal vaginal discharge.   °· You need prescription medicine for your pain.   °· You vomit.   °· You cannot take your medicines.   °· Your partner has an STD.   °SEEK IMMEDIATE MEDICAL CARE IF:  °· You have a fever.   °· You have increased abdominal or pelvic pain.   °· You have chills.   °· You have pain when you urinate.   °· You are not better after 72 hours following treatment.   °MAKE SURE YOU:  °· Understand these instructions. °· Will watch your condition. °· Will get help right away if you are not doing well or get worse. °  Document Released: 04/30/2005 Document Revised: 08/25/2012 Document Reviewed: 04/26/2011 °[employer]® Patient Information ©2015 [employer]. This information is not intended to replace advice given to you by your health care provider. Make sure you discuss any questions you have with your health care provider. ° °

## 2015-02-10 ENCOUNTER — Emergency Department: Payer: Medicaid Other

## 2015-02-10 ENCOUNTER — Emergency Department
Admission: EM | Admit: 2015-02-10 | Discharge: 2015-02-10 | Disposition: A | Discharge status: Home / Self Care | Payer: Medicaid Other | Attending: Emergency Medicine | Admitting: Emergency Medicine

## 2015-02-10 ENCOUNTER — Encounter: Payer: Self-pay | Admitting: Emergency Medicine

## 2015-02-10 DIAGNOSIS — Z792 Long term (current) use of antibiotics: Secondary | ICD-10-CM | POA: Diagnosis not present

## 2015-02-10 DIAGNOSIS — S40212A Abrasion of left shoulder, initial encounter: Secondary | ICD-10-CM | POA: Diagnosis not present

## 2015-02-10 DIAGNOSIS — Z79899 Other long term (current) drug therapy: Secondary | ICD-10-CM | POA: Diagnosis not present

## 2015-02-10 DIAGNOSIS — M79642 Pain in left hand: Secondary | ICD-10-CM

## 2015-02-10 DIAGNOSIS — S6992XA Unspecified injury of left wrist, hand and finger(s), initial encounter: Secondary | ICD-10-CM | POA: Diagnosis present

## 2015-02-10 DIAGNOSIS — I10 Essential (primary) hypertension: Secondary | ICD-10-CM | POA: Insufficient documentation

## 2015-02-10 DIAGNOSIS — Y9389 Activity, other specified: Secondary | ICD-10-CM | POA: Diagnosis not present

## 2015-02-10 DIAGNOSIS — Z720 Tobacco use: Secondary | ICD-10-CM | POA: Diagnosis not present

## 2015-02-10 DIAGNOSIS — Y92410 Unspecified street and highway as the place of occurrence of the external cause: Secondary | ICD-10-CM | POA: Diagnosis not present

## 2015-02-10 DIAGNOSIS — T07XXXA Unspecified multiple injuries, initial encounter: Secondary | ICD-10-CM

## 2015-02-10 DIAGNOSIS — S40012A Contusion of left shoulder, initial encounter: Secondary | ICD-10-CM | POA: Insufficient documentation

## 2015-02-10 DIAGNOSIS — Y998 Other external cause status: Secondary | ICD-10-CM | POA: Diagnosis not present

## 2015-02-10 DIAGNOSIS — Z880 Allergy status to penicillin: Secondary | ICD-10-CM | POA: Insufficient documentation

## 2015-02-10 DIAGNOSIS — S60512A Abrasion of left hand, initial encounter: Secondary | ICD-10-CM | POA: Diagnosis not present

## 2015-02-10 DIAGNOSIS — Z7982 Long term (current) use of aspirin: Secondary | ICD-10-CM | POA: Insufficient documentation

## 2015-02-10 DIAGNOSIS — M79632 Pain in left forearm: Secondary | ICD-10-CM

## 2015-02-10 DIAGNOSIS — M25512 Pain in left shoulder: Secondary | ICD-10-CM

## 2015-02-10 MED ORDER — IBUPROFEN 600 MG PO TABS: 600.0000 mg | ORAL_TABLET | Freq: Three times a day (TID) | ORAL | Status: DC

## 2015-02-10 MED ORDER — HYDROCODONE-ACETAMINOPHEN 5-325 MG PO TABS: 1.0000 | ORAL_TABLET | ORAL | Status: DC | PRN
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
## 2015-03-14 ENCOUNTER — Emergency Department
Admission: EM | Admit: 2015-03-14 | Discharge: 2015-03-14 | Disposition: A | Discharge status: Home / Self Care | Payer: Medicaid Other | Attending: Emergency Medicine | Admitting: Emergency Medicine

## 2015-03-14 ENCOUNTER — Encounter: Payer: Self-pay | Admitting: Emergency Medicine

## 2015-03-14 DIAGNOSIS — Y998 Other external cause status: Secondary | ICD-10-CM | POA: Diagnosis not present

## 2015-03-14 DIAGNOSIS — Z7982 Long term (current) use of aspirin: Secondary | ICD-10-CM | POA: Diagnosis not present

## 2015-03-14 DIAGNOSIS — Z880 Allergy status to penicillin: Secondary | ICD-10-CM | POA: Insufficient documentation

## 2015-03-14 DIAGNOSIS — M62838 Other muscle spasm: Secondary | ICD-10-CM | POA: Insufficient documentation

## 2015-03-14 DIAGNOSIS — Y92410 Unspecified street and highway as the place of occurrence of the external cause: Secondary | ICD-10-CM | POA: Insufficient documentation

## 2015-03-14 DIAGNOSIS — M62830 Muscle spasm of back: Secondary | ICD-10-CM

## 2015-03-14 DIAGNOSIS — Z79899 Other long term (current) drug therapy: Secondary | ICD-10-CM | POA: Diagnosis not present

## 2015-03-14 DIAGNOSIS — Z792 Long term (current) use of antibiotics: Secondary | ICD-10-CM | POA: Insufficient documentation

## 2015-03-14 DIAGNOSIS — S3992XA Unspecified injury of lower back, initial encounter: Secondary | ICD-10-CM | POA: Diagnosis present

## 2015-03-14 DIAGNOSIS — Y9389 Activity, other specified: Secondary | ICD-10-CM | POA: Diagnosis not present

## 2015-03-14 DIAGNOSIS — I10 Essential (primary) hypertension: Secondary | ICD-10-CM | POA: Insufficient documentation

## 2015-03-14 DIAGNOSIS — Z720 Tobacco use: Secondary | ICD-10-CM | POA: Diagnosis not present

## 2015-03-14 MED ORDER — KETOROLAC TROMETHAMINE 30 MG/ML IJ SOLN
60.0000 mg | Freq: Once | INTRAMUSCULAR | Status: AC
  Administered 2015-03-14: 60 mg via INTRAMUSCULAR
  Filled 2015-03-14: qty 2

## 2015-03-14 MED ORDER — CYCLOBENZAPRINE HCL 10 MG PO TABS: 10.0000 mg | ORAL_TABLET | Freq: Three times a day (TID) | ORAL | Status: DC | PRN

## 2015-03-14 MED ORDER — MELOXICAM 15 MG PO TABS: 15.0000 mg | ORAL_TABLET | Freq: Every day | ORAL | Status: DC

## 2015-03-14 NOTE — ED Notes (Signed)
Pt to ed with c/o back pain after walking [place] night at the park.  Pt ambulates with ease at triage.
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
## 2015-03-14 NOTE — Discharge Instructions (Signed)
Back Exercises The following exercises strengthen the muscles that help to support the back. They also help to keep the lower back flexible. Doing these exercises can help to prevent back pain or lessen existing pain. If you have back pain or discomfort, try doing these exercises 2-3 times each day or as told by your health care provider. When the pain goes away, do them once each day, but increase the number of times that you repeat the steps for each exercise (do more repetitions). If you do not have back pain or discomfort, do these exercises once each day or as told by your health care provider. EXERCISES Single Knee to Chest Repeat these steps 3-5 times for each leg:  Lie on your back on a firm bed or the floor with your legs extended.  Bring one knee to your chest. Your other leg should stay extended and in contact with the floor.  Hold your knee in place by grabbing your knee or thigh.  Pull on your knee until you feel a gentle stretch in your lower back.  Hold the stretch for 10-30 seconds.  Slowly release and straighten your leg. Pelvic Tilt Repeat these steps 5-10 times:  Lie on your back on a firm bed or the floor with your legs extended.  Bend your knees so they are pointing toward the ceiling and your feet are flat on the floor.  Tighten your lower abdominal muscles to press your lower back against the floor. This motion will tilt your pelvis so your tailbone points up toward the ceiling instead of pointing to your feet or the floor.  With gentle tension and even breathing, hold this position for 5-10 seconds. Cat-Cow Repeat these steps until your lower back becomes more flexible:  Get into a hands-and-knees position on a firm surface. Keep your hands under your shoulders, and keep your knees under your hips. You may place padding under your knees for comfort.  Let your head hang down, and point your tailbone toward the floor so your lower back becomes rounded like the  back of a cat.  Hold this position for 5 seconds.  Slowly lift your head and point your tailbone up toward the ceiling so your back forms a sagging arch like the back of a cow.  Hold this position for 5 seconds. Press-Ups Repeat these steps 5-10 times:  Lie on your abdomen (face-down) on the floor.  Place your palms near your head, about shoulder-width apart.  While you keep your back as relaxed as possible and keep your hips on the floor, slowly straighten your arms to raise the top half of your body and lift your shoulders. Do not use your back muscles to raise your upper torso. You may adjust the placement of your hands to make yourself more comfortable.  Hold this position for 5 seconds while you keep your back relaxed.  Slowly return to lying flat on the floor. Bridges Repeat these steps 10 times:  Lie on your back on a firm surface.  Bend your knees so they are pointing toward the ceiling and your feet are flat on the floor.  Tighten your buttocks muscles and lift your buttocks off of the floor until your waist is at almost the same height as your knees. You should feel the muscles working in your buttocks and the back of your thighs. If you do not feel these muscles, slide your feet 1-2 inches farther away from your buttocks.  Hold this position for 3-5  seconds.  Slowly lower your hips to the starting position, and allow your buttocks muscles to relax completely. If this exercise is too easy, try doing it with your arms crossed over your chest. Abdominal Crunches Repeat these steps 5-10 times:  Lie on your back on a firm bed or the floor with your legs extended.  Bend your knees so they are pointing toward the ceiling and your feet are flat on the floor.  Cross your arms over your chest.  Tip your chin slightly toward your chest without bending your neck.  Tighten your abdominal muscles and slowly raise your trunk (torso) high enough to lift your shoulder blades a  tiny bit off of the floor. Avoid raising your torso higher than that, because it can put too much stress on your low back and it does not help to strengthen your abdominal muscles.  Slowly return to your starting position. Back Lifts Repeat these steps 5-10 times: 1. Lie on your abdomen (face-down) with your arms at your sides, and rest your forehead on the floor. 2. Tighten the muscles in your legs and your buttocks. 3. Slowly lift your chest off of the floor while you keep your hips pressed to the floor. Keep the back of your head in line with the curve in your back. Your eyes should be looking at the floor. 4. Hold this position for 3-5 seconds. 5. Slowly return to your starting position. SEEK MEDICAL CARE IF:  Your back pain or discomfort gets much worse when you do an exercise.  Your back pain or discomfort does not lessen within 2 hours after you exercise. If you have any of these problems, stop doing these exercises right away. Do not do them again unless your health care provider says that you can. SEEK IMMEDIATE MEDICAL CARE IF:  You develop sudden, severe back pain. If this happens, stop doing the exercises right away. Do not do them again unless your health care provider says that you can.   This information is not intended to replace advice given to you by your health care provider. Make sure you discuss any questions you have with your health care provider.   Document Released: 06/07/2004 Document Revised: 01/19/2015 Document Reviewed: 06/24/2014 Elsevier Interactive Patient Education 2016 [place] Injury Prevention Back injuries can be very painful. They can also be difficult to heal. After having one back injury, you are more likely to injure your back again. It is important to learn how to avoid injuring or re-injuring your back. The following tips can help you to prevent a back injury. WHAT SHOULD I KNOW ABOUT PHYSICAL FITNESS?  Exercise for 30 minutes per  day on most days of the week or as directed by your health care provider. Make sure to:  Do aerobic exercises, such as walking, jogging, biking, or swimming.  Do exercises that increase balance and strength, such as tai chi and yoga. These can decrease your risk of falling and injuring your back.  Do stretching exercises to help with flexibility.  Try to develop strong abdominal muscles. Your abdominal muscles provide a lot of the support that is needed by your back.  Maintain a healthy weight. This helps to decrease your risk of a back injury. WHAT SHOULD I KNOW ABOUT MY DIET?  Talk with your health care provider about your overall diet. Take supplements and vitamins only as directed by your health care provider.  Talk with your health care provider about how much calcium and  D you need each day. These nutrients help to prevent weakening of the bones (osteoporosis). Osteoporosis can cause broken (fractured) bones, which lead to back pain.  Include good sources of calcium in your diet, such as dairy products, green leafy vegetables, and products that have had calcium added to them (fortified).  Include good sources of vitamin D in your diet, such as milk and foods that are fortified with vitamin D. WHAT SHOULD I KNOW ABOUT MY POSTURE?  Sit up straight and stand up straight. Avoid leaning forward when you sit or hunching over when you stand.  Choose chairs that have good low-back (lumbar) support.  If you work at a desk, sit close to it so you do not need to lean over. Keep your chin tucked in. Keep your neck drawn back, and keep your elbows bent at a right angle. Your arms should look like the letter "L."  Sit high and close to the steering wheel when you drive. Add a lumbar support to your car seat, if needed.  Avoid sitting or standing in one position for very long. Take breaks to get up, stretch, and walk around at least one time every hour. Take breaks every hour if you are  driving for long periods of time.  Sleep on your side with your knees slightly bent, or sleep on your back with a pillow under your knees. Do not lie on the front of your body to sleep. WHAT SHOULD I KNOW ABOUT LIFTING, TWISTING, AND REACHING? Lifting and Heavy Lifting  Avoid heavy lifting, especially repetitive heavy lifting. If you must do heavy lifting:  Stretch before lifting.  Work slowly.  Rest between lifts.  Use a tool such as a cart or a dolly to move objects if one is available.  Make several small trips instead of carrying one heavy load.  Ask for help when you need it, especially when moving big objects.  Follow these steps when lifting:  Stand with your feet shoulder-width apart.  Get as close to the object as you can. Do not try to pick up a heavy object that is far from your body.  Use handles or lifting straps if they are available.  Bend at your knees. Squat down, but keep your heels off the floor.  Keep your shoulders pulled back, your chin tucked in, and your back straight.  Lift the object slowly while you tighten the muscles in your legs, abdomen, and buttocks. Keep the object as close to the center of your body as possible.  Follow these steps when putting down a heavy load:  Stand with your feet shoulder-width apart.  Lower the object slowly while you tighten the muscles in your legs, abdomen, and buttocks. Keep the object as close to the center of your body as possible.  Keep your shoulders pulled back, your chin tucked in, and your back straight.  Bend at your knees. Squat down, but keep your heels off the floor.  Use handles or lifting straps if they are available. Twisting and Reaching  Avoid lifting heavy objects above your waist.  Do not twist at your waist while you are lifting or carrying a load. If you need to turn, move your feet.  Do not bend over without bending at your knees.  Avoid reaching over your head, across a table, or  for an object on a high surface. WHAT ARE SOME OTHER TIPS?  Avoid wet floors and icy ground. Keep sidewalks clear of ice to prevent falls.    falls.  Do not sleep on a mattress that is too soft or too hard.  Keep items that are used frequently within easy reach.  Put heavier objects on shelves at waist level, and put lighter objects on lower or higher shelves.  Find ways to decrease your stress, such as exercise, massage, or relaxation techniques. Stress can build up in your muscles. Tense muscles are more vulnerable to injury.  Talk with your health care provider if you feel anxious or depressed. These conditions can make back pain worse.  Wear flat heel shoes with cushioned soles.  Avoid sudden movements.  Use both shoulder straps when carrying a backpack.  Do not use any tobacco products, including cigarettes, chewing tobacco, or electronic cigarettes. If you need help quitting, ask your health care provider.   This information is not intended to replace advice given to you by your health care provider. Make sure you discuss any questions you have with your health care provider.   Document Released: 06/07/2004 Document Revised: 09/14/2014 Document Reviewed: 05/04/2014 Elsevier Interactive Patient Education 2016 [place] therapy can help ease sore, stiff, injured, and tight muscles and joints. Heat relaxes your muscles, which may help ease your pain.  RISKS AND COMPLICATIONS If you have any of the following conditions, do not use heat therapy unless your health care provider has approved:  Poor circulation.  Healing wounds or scarred skin in the area being treated.  Diabetes, heart disease, or high blood pressure.  Not being able to feel (numbness) the area being treated.  Unusual swelling of the area being treated.  Active infections.  Blood clots.  Cancer.  Inability to communicate pain. This may include young children and people who have problems  with their brain function (dementia).  Pregnancy. Heat therapy should only be used on old, pre-existing, or long-lasting (chronic) injuries. Do not use heat therapy on new injuries unless directed by your health care provider. HOW TO USE HEAT THERAPY There are several different kinds of heat therapy, including:  Moist heat pack.  Warm water bath.  Hot water bottle.  Electric heating pad.  Heated gel pack.  Heated wrap.  Electric heating pad. Use the heat therapy method suggested by your health care provider. Follow your health care provider's instructions on when and how to use heat therapy. GENERAL HEAT THERAPY RECOMMENDATIONS  Do not sleep while using heat therapy. Only use heat therapy while you are awake.  Your skin may turn pink while using heat therapy. Do not use heat therapy if your skin turns red.  Do not use heat therapy if you have new pain.  High heat or long exposure to heat can cause burns. Be careful when using heat therapy to avoid burning your skin.  Do not use heat therapy on areas of your skin that are already irritated, such as with a rash or sunburn. SEEK MEDICAL CARE IF:  You have blisters, redness, swelling, or numbness.  You have new pain.  Your pain is worse. MAKE SURE YOU:  Understand these instructions.  Will watch your condition.  Will get help right away if you are not doing well or get worse.   This information is not intended to replace advice given to you by your health care provider. Make sure you discuss any questions you have with your health care provider.   Document Released: 07/23/2011 Document Revised: 05/21/2014 Document Reviewed: 06/23/2013 Elsevier Interactive Patient Education 2016 [employer]  Muscle Cramps and Spasms Muscle  cramps and spasms occur when a muscle or muscles tighten and you have no control over this tightening (involuntary muscle contraction). They are a common problem and can develop in any muscle. The  most common place is in the calf muscles of the leg. Both muscle cramps and muscle spasms are involuntary muscle contractions, but they also have differences:   Muscle cramps are sporadic and painful. They may last a few seconds to a quarter of an hour. Muscle cramps are often more forceful and last longer than muscle spasms.  Muscle spasms may or may not be painful. They may also last just a few seconds or much longer. CAUSES  It is uncommon for cramps or spasms to be due to a serious underlying problem. In many cases, the cause of cramps or spasms is unknown. Some common causes are:   Overexertion.   Overuse from repetitive motions (doing the same thing over and over).   Remaining in a certain position for a long period of time.   Improper preparation, form, or technique while performing a sport or activity.   Dehydration.   Injury.   Side effects of some medicines.   Abnormally low levels of the salts and ions in your blood (electrolytes), especially potassium and calcium. This could happen if you are taking water pills (diuretics) or you are pregnant.  Some underlying medical problems can make it more likely to develop cramps or spasms. These include, but are not limited to:   Diabetes.   Parkinson disease.   Hormone disorders, such as thyroid problems.   Alcohol abuse.   Diseases specific to muscles, joints, and bones.   Blood vessel disease where not enough blood is getting to the muscles.  HOME CARE INSTRUCTIONS   Stay well hydrated. Drink enough water and fluids to keep your urine clear or pale yellow.  It may be helpful to massage, stretch, and relax the affected muscle.  For tight or tense muscles, use a warm towel, heating pad, or hot shower water directed to the affected area.  If you are sore or have pain after a cramp or spasm, applying ice to the affected area may relieve discomfort.  Put ice in a plastic bag.  Place a towel between your  skin and the bag.  Leave the ice on for 15-20 minutes, 03-04 times a day.  Medicines used to treat a known cause of cramps or spasms may help reduce their frequency or severity. Only take over-the-counter or prescription medicines as directed by your caregiver. SEEK MEDICAL CARE IF:  Your cramps or spasms get more severe, more frequent, or do not improve over time.  MAKE SURE YOU:   Understand these instructions.  Will watch your condition.  Will get help right away if you are not doing well or get worse.   This information is not intended to replace advice given to you by your health care provider. Make sure you discuss any questions you have with your health care provider.   Document Released: 10/20/2001 Document Revised: 08/25/2012 Document Reviewed: 04/16/2012 Elsevier Interactive Patient Education [employer].
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
## 2015-04-12 ENCOUNTER — Ambulatory Visit: Payer: Self-pay | Admitting: Family Medicine

## 2015-04-19 ENCOUNTER — Encounter: Payer: Self-pay | Admitting: Emergency Medicine

## 2015-04-19 ENCOUNTER — Emergency Department
Admission: EM | Admit: 2015-04-19 | Discharge: 2015-04-19 | Disposition: A | Discharge status: Home / Self Care | Payer: Medicaid Other | Attending: Emergency Medicine | Admitting: Emergency Medicine

## 2015-04-19 DIAGNOSIS — Z7982 Long term (current) use of aspirin: Secondary | ICD-10-CM | POA: Insufficient documentation

## 2015-04-19 DIAGNOSIS — Z792 Long term (current) use of antibiotics: Secondary | ICD-10-CM | POA: Insufficient documentation

## 2015-04-19 DIAGNOSIS — Z79899 Other long term (current) drug therapy: Secondary | ICD-10-CM | POA: Diagnosis not present

## 2015-04-19 DIAGNOSIS — F17210 Nicotine dependence, cigarettes, uncomplicated: Secondary | ICD-10-CM | POA: Diagnosis not present

## 2015-04-19 DIAGNOSIS — J069 Acute upper respiratory infection, unspecified: Secondary | ICD-10-CM | POA: Diagnosis not present

## 2015-04-19 DIAGNOSIS — Z791 Long term (current) use of non-steroidal anti-inflammatories (NSAID): Secondary | ICD-10-CM | POA: Diagnosis not present

## 2015-04-19 DIAGNOSIS — R05 Cough: Secondary | ICD-10-CM | POA: Diagnosis present

## 2015-04-19 DIAGNOSIS — J4520 Mild intermittent asthma, uncomplicated: Secondary | ICD-10-CM | POA: Insufficient documentation

## 2015-04-19 DIAGNOSIS — Z880 Allergy status to penicillin: Secondary | ICD-10-CM | POA: Diagnosis not present

## 2015-04-19 DIAGNOSIS — I10 Essential (primary) hypertension: Secondary | ICD-10-CM | POA: Insufficient documentation

## 2015-04-19 MED ORDER — CETIRIZINE HCL 10 MG PO TABS: 10.0000 mg | ORAL_TABLET | Freq: Every day | ORAL | Status: DC

## 2015-04-19 MED ORDER — FLUTICASONE PROPIONATE 50 MCG/ACT NA SUSP: 1.0000 | Freq: Two times a day (BID) | NASAL | Status: DC

## 2015-04-19 MED ORDER — ALBUTEROL SULFATE HFA 108 (90 BASE) MCG/ACT IN AERS: 2.0000 | INHALATION_SPRAY | RESPIRATORY_TRACT | Status: DC | PRN

## 2015-04-19 MED ORDER — PREDNISONE 10 MG PO TABS: 10.0000 mg | ORAL_TABLET | ORAL | Status: DC

## 2015-04-19 NOTE — ED Notes (Signed)
Patient ambulatory to triage with steady gait, without difficulty or distress noted; pt reports nonprod cough since Sunday with sinus congestion and back pain from coughing

## 2015-04-19 NOTE — Discharge Instructions (Signed)
Asthma, Adult °Asthma is a recurring condition in which the airways tighten and narrow. Asthma can make it difficult to breathe. It can cause coughing, wheezing, and shortness of breath. Asthma episodes, also called asthma attacks, range from minor to life-threatening. Asthma cannot be cured, but medicines and lifestyle changes can help control it. °CAUSES °Asthma is believed to be caused by inherited (genetic) and environmental factors, but its exact cause is unknown. Asthma may be triggered by allergens, lung infections, or irritants in the air. Asthma triggers are different for each person. Common triggers include:  °· Animal dander. °· Dust mites. °· Cockroaches. °· Pollen from trees or grass. °· Mold. °· Smoke. °· Air pollutants such as dust, household cleaners, hair sprays, aerosol sprays, paint fumes, strong chemicals, or strong odors. °· Cold air, weather changes, and winds (which increase molds and pollens in the air). °· Strong emotional expressions such as crying or laughing hard. °· Stress. °· Certain medicines (such as aspirin) or types of drugs (such as beta-blockers). °· Sulfites in foods and drinks. Foods and drinks that may contain sulfites include dried fruit, potato chips, and sparkling grape juice. °· Infections or inflammatory conditions such as the flu, a cold, or an inflammation of the nasal membranes (rhinitis). °· Gastroesophageal reflux disease (GERD). °· Exercise or strenuous activity. °SYMPTOMS °Symptoms may occur immediately after asthma is triggered or many hours later. Symptoms include: °· Wheezing. °· Excessive nighttime or early morning coughing. °· Frequent or severe coughing with a common cold. °· Chest tightness. °· Shortness of breath. °DIAGNOSIS  °The diagnosis of asthma is made by a review of your medical history and a physical exam. Tests may also be performed. These may include: °· Lung function studies. These tests show how much air you breathe in and out. °· Allergy  tests. °· Imaging tests such as X-rays. °TREATMENT  °Asthma cannot be cured, but it can usually be controlled. Treatment involves identifying and avoiding your asthma triggers. It also involves medicines. There are 2 classes of medicine used for asthma treatment:  °· Controller medicines. These prevent asthma symptoms from occurring. They are usually taken every day. °· Reliever or rescue medicines. These quickly relieve asthma symptoms. They are used as needed and provide short-term relief. °Your health care provider will help you create an asthma action plan. An asthma action plan is a written plan for managing and treating your asthma attacks. It includes a list of your asthma triggers and how they may be avoided. It also includes information on when medicines should be taken and when their dosage should be changed. An action plan may also involve the use of a device called a peak flow meter. A peak flow meter measures how well the lungs are working. It helps you monitor your condition. °HOME CARE INSTRUCTIONS  °· Take medicines only as directed by your health care provider. Speak with your health care provider if you have questions about how or when to take the medicines. °· Use a peak flow meter as directed by your health care provider. Record and keep track of readings. °· Understand and use the action plan to help minimize or stop an asthma attack without needing to seek medical care. °· Control your home environment in the following ways to help prevent asthma attacks: °· Do not smoke. Avoid being exposed to secondhand smoke. °· Change your heating and air conditioning filter regularly. °· Limit your use of fireplaces and wood stoves. °· Get rid of pests (such as roaches   and mice) and their droppings.  Throw away plants if you see mold on them.  Clean your floors and dust regularly. Use unscented cleaning products.  Try to have someone else vacuum for you regularly. Stay out of rooms while they are  being vacuumed and for a short while afterward. If you vacuum, use a dust mask from a hardware store, a double-layered or microfilter vacuum cleaner bag, or a vacuum cleaner with a HEPA filter.  Replace carpet with wood, tile, or vinyl flooring. Carpet can trap dander and dust.  Use allergy-proof pillows, mattress covers, and box spring covers.  Wash bed sheets and blankets every week in hot water and dry them in a dryer.  Use blankets that are made of polyester or cotton.  Clean bathrooms and kitchens with bleach. If possible, have someone repaint the walls in these rooms with mold-resistant paint. Keep out of the rooms that are being cleaned and painted.  Wash hands frequently. SEEK MEDICAL CARE IF:   You have wheezing, shortness of breath, or a cough even if taking medicine to prevent attacks.  The colored mucus you cough up (sputum) is thicker than usual.  Your sputum changes from clear or white to yellow, green, gray, or bloody.  You have any problems that may be related to the medicines you are taking (such as a rash, itching, swelling, or trouble breathing).  You are using a reliever medicine more than 2-3 times per week.  Your peak flow is still at 50-79% of your personal best after following your action plan for 1 hour.  You have a fever. SEEK IMMEDIATE MEDICAL CARE IF:   You seem to be getting worse and are unresponsive to treatment during an asthma attack.  You are short of breath even at rest.  You get short of breath when doing very little physical activity.  You have difficulty eating, drinking, or talking due to asthma symptoms.  You develop chest pain.  You develop a fast heartbeat.  You have a bluish color to your lips or fingernails.  You are light-headed, dizzy, or faint.  Your peak flow is less than 50% of your personal best.   This information is not intended to replace advice given to you by your health care provider. Make sure you discuss any  questions you have with your health care provider.   Document Released: 04/30/2005 Document Revised: 01/19/2015 Document Reviewed: 11/27/2012 Elsevier Interactive Patient Education 2016 [employer]  Viral Infections A viral infection can be caused by different types of viruses.Most viral infections are not serious and resolve on their own. However, some infections may cause severe symptoms and may lead to further complications. SYMPTOMS Viruses can frequently cause:  Minor sore throat.  Aches and pains.  Headaches.  Runny nose.  Different types of rashes.  Watery eyes.  Tiredness.  Cough.  Loss of appetite.  Gastrointestinal infections, resulting in nausea, vomiting, and diarrhea. These symptoms do not respond to antibiotics because the infection is not caused by bacteria. However, you might catch a bacterial infection following the viral infection. This is sometimes called a "superinfection." Symptoms of such a bacterial infection may include:  Worsening sore throat with pus and difficulty swallowing.  Swollen neck glands.  Chills and a high or persistent fever.  Severe headache.  Tenderness over the sinuses.  Persistent overall ill feeling (malaise), muscle aches, and tiredness (fatigue).  Persistent cough.  Yellow, green, or brown mucus production with coughing. HOME CARE INSTRUCTIONS   Only take  over-the-counter or prescription medicines for pain, discomfort, diarrhea, or fever as directed by your caregiver.  Drink enough water and fluids to keep your urine clear or pale yellow. Sports drinks can provide valuable electrolytes, sugars, and hydration.  Get plenty of rest and maintain proper nutrition. Soups and broths with crackers or rice are fine. SEEK IMMEDIATE MEDICAL CARE IF:   You have severe headaches, shortness of breath, chest pain, neck pain, or an unusual rash.  You have uncontrolled vomiting, diarrhea, or you are unable to keep down  fluids.  You or your child has an oral temperature above 102 F (38.9 C), not controlled by medicine.  Your baby is older than 3 months with a rectal temperature of 102 F (38.9 C) or higher.  Your baby is 48 months old or younger with a rectal temperature of 100.4 F (38 C) or higher. MAKE SURE YOU:   Understand these instructions.  Will watch your condition.  Will get help right away if you are not doing well or get worse.   This information is not intended to replace advice given to you by your health care provider. Make sure you discuss any questions you have with your health care provider.   Document Released: 02/07/2005 Document Revised: 07/23/2011 Document Reviewed: 10/06/2014 Elsevier Interactive Patient Education [employer]

## 2015-04-19 NOTE — ED Provider Notes (Signed)
[facility] Emergency Department Provider Note  ____________________________________________  Time seen: Approximately 7:17 AM  I have reviewed the triage vital signs and the nursing notes.   HISTORY  Chief Complaint Cough    HPI Laurie Stephens is a 29 y.o. female who presents to emergency department complaining of nasal congestion, and cough. She states that symptoms began on Sunday. She hasn't underlying history of asthma that has worsened every time she has "a cold." She states the cough is nonproductive. She denies any shortness of breath or audible wheezing. She has used her prescribed albuterol inhaler over the past several days but is now out. She denies any fevers or chills, headache, neck pain, chest pain, shortness breath, abdominal pain, nausea or vomiting. Cough is constant, moderate, unrelieved by over-the-counter medications.   Past Medical History  Diagnosis Date  . Hypertension   . Asthma     There are no active problems to display for this patient.   Past Surgical History  Procedure Laterality Date  . Dilitation and curretage    . Meth addiction      06/10/2012, No use for 2 years    Current Outpatient Rx  Name  Route  Sig  Dispense  Refill  . albuterol (PROVENTIL HFA;VENTOLIN HFA) 108 (90 BASE) MCG/ACT inhaler   Inhalation   Inhale 2 puffs into the lungs every 6 (six) hours as needed for wheezing.         Marland Kitchen. albuterol (PROVENTIL HFA;VENTOLIN HFA) 108 (90 BASE) MCG/ACT inhaler   Inhalation   Inhale 2 puffs into the lungs every 4 (four) hours as needed for wheezing or shortness of breath.   1 Inhaler   0   . aspirin 325 MG tablet   Oral   Take 650 mg by mouth 3 (three) times daily.         . cetirizine (ZYRTEC) 10 MG tablet   Oral   Take 1 tablet (10 mg total) by mouth daily.   30 tablet   0   . cyclobenzaprine (FLEXERIL) 10 MG tablet   Oral   Take 1 tablet (10 mg total) by mouth 3 (three) times daily as needed for  muscle spasms.   15 tablet   0   . doxycycline (VIBRAMYCIN) 100 MG capsule   Oral   Take 1 capsule (100 mg total) by mouth 2 (two) times daily.   14 capsule   0   . fluticasone (FLONASE) 50 MCG/ACT nasal spray   Each Nare   Place 1 spray into both nostrils 2 (two) times daily.   16 g   0   . HYDROcodone-acetaminophen (NORCO/VICODIN) 5-325 MG tablet   Oral   Take 1 tablet by mouth every 4 (four) hours as needed for moderate pain.   20 tablet   0   . ibuprofen (ADVIL,MOTRIN) 600 MG tablet   Oral   Take 1 tablet (600 mg total) by mouth 3 (three) times daily.   30 tablet   0   . meloxicam (MOBIC) 15 MG tablet   Oral   Take 1 tablet (15 mg total) by mouth daily.   30 tablet   0   . metroNIDAZOLE (FLAGYL) 500 MG tablet   Oral   Take 1 tablet (500 mg total) by mouth 2 (two) times daily.   14 tablet   0   . ondansetron (ZOFRAN ODT) 4 MG disintegrating tablet   Oral   Take 1 tablet (4 mg total) by mouth every 8 (  eight) hours as needed for nausea or vomiting.   15 tablet   0   . predniSONE (DELTASONE) 10 MG tablet   Oral   Take 1 tablet (10 mg total) by mouth as directed.   21 tablet   0     Take on a daily basis of 6, 5, 4, 3, 2, 1     Allergies Penicillins  No family history on file.  Social History Social History  Substance Use Topics  . Smoking status: Current Every Day Smoker -- 1.00 packs/day    Types: Cigarettes  . Smokeless tobacco: Never Used  . Alcohol Use: No     Comment: ocassionally    Review of Systems Constitutional: No fever/chills Eyes: No visual changes. ENT: No sore throat. Endorses nasal congestion. Cardiovascular: Denies chest pain. Respiratory: Denies shortness of breath. Endorses nonproductive cough. Gastrointestinal: No abdominal pain.  No nausea, no vomiting.  No diarrhea.  No constipation. Genitourinary: Negative for dysuria. Musculoskeletal: Negative for back pain. Skin: Negative for rash. Neurological: Negative for  headaches, focal weakness or numbness.  10-point ROS otherwise negative.  ____________________________________________   PHYSICAL EXAM:  VITAL SIGNS: ED Triage Vitals  Enc Vitals Group     BP 04/19/15 0642 157/90 mmHg     Pulse Rate 04/19/15 0642 88     Resp 04/19/15 0642 18     Temp 04/19/15 0642 97.7 F (36.5 C)     Temp Source 04/19/15 0642 Oral     SpO2 04/19/15 0642 97 %     Weight 04/19/15 0642 170 lb (77.111 kg)     Height 04/19/15 0642  (1.6 m)     Head Cir --      Peak Flow --      Pain Score 04/19/15 0643 9     Pain Loc --      Pain Edu? --      Excl. in GC? --     Constitutional: Alert and oriented. Well appearing and in no acute distress. Eyes: Conjunctivae are normal. PERRL. EOMI. Head: Atraumatic. External auditory canals and TMs unremarkable bilaterally Nose: Moderate congestion/rhinnorhea. Mouth/Throat: Mucous membranes are moist.  Oropharynx non-erythematous. Neck: No stridor.   Hematological/Lymphatic/Immunilogical: No cervical lymphadenopathy. Cardiovascular: Normal rate, regular rhythm. Grossly normal heart sounds.  Good peripheral circulation. Respiratory: Normal respiratory effort.  No retractions. Lungs with minimal scattered expiratory wheezing. Gastrointestinal: Soft and nontender. No distention. No abdominal bruits. No CVA tenderness. Musculoskeletal: No lower extremity tenderness nor edema.  No joint effusions. Neurologic:  Normal speech and language. No gross focal neurologic deficits are appreciated. No gait instability. Skin:  Skin is warm, dry and intact. No rash noted. Psychiatric: Mood and affect are normal. Speech and behavior are normal.  ____________________________________________   LABS (all labs ordered are listed, but only abnormal results are displayed)  Labs Reviewed - No data to  display ____________________________________________  EKG   ____________________________________________  RADIOLOGY   ____________________________________________   PROCEDURES  Procedure(s) performed: None  Critical Care performed: No  ____________________________________________   INITIAL IMPRESSION / ASSESSMENT AND PLAN / ED COURSE  Pertinent labs & imaging results that were available during my care of the patient were reviewed by me and considered in my medical decision making (see chart for details).  Patient's diagnosis is consistent with viral upper respiratory infection with aggravation to mild intermittent asthma. Patient will be placed on Zyrtec, Flonase, albuterol, and prednisone. Patient is advised of her diagnosis and treatment plan and verbalizes understanding and compliance with  same. ____________________________________________   FINAL CLINICAL IMPRESSION(S) / ED DIAGNOSES  Final diagnoses:  Viral upper respiratory illness  Asthma, mild intermittent, uncomplicated      Racheal Patches, PA-C 04/19/15 4540  Jene Every, MD 04/19/15 1055

## 2015-04-19 NOTE — ED Notes (Signed)
Pt presents with cough and nasal congestion since Sunday. Denies any chest pain or shortness of breath and states is out of inhaler at home. No acute resp distress noted.

## 2015-04-26 ENCOUNTER — Ambulatory Visit: Payer: Self-pay | Admitting: Family Medicine

## 2015-05-04 ENCOUNTER — Emergency Department
Admission: EM | Admit: 2015-05-04 | Discharge: 2015-05-05 | Disposition: A | Discharge status: Other Acute Inpatient Hospital | Payer: Medicaid Other | Attending: Emergency Medicine | Admitting: Emergency Medicine

## 2015-05-04 ENCOUNTER — Encounter: Payer: Self-pay | Admitting: Urgent Care

## 2015-05-04 DIAGNOSIS — F329 Major depressive disorder, single episode, unspecified: Secondary | ICD-10-CM | POA: Insufficient documentation

## 2015-05-04 DIAGNOSIS — I10 Essential (primary) hypertension: Secondary | ICD-10-CM | POA: Diagnosis not present

## 2015-05-04 DIAGNOSIS — F17210 Nicotine dependence, cigarettes, uncomplicated: Secondary | ICD-10-CM | POA: Insufficient documentation

## 2015-05-04 DIAGNOSIS — F4310 Post-traumatic stress disorder, unspecified: Secondary | ICD-10-CM | POA: Diagnosis not present

## 2015-05-04 DIAGNOSIS — F32A Depression, unspecified: Secondary | ICD-10-CM

## 2015-05-04 DIAGNOSIS — F1210 Cannabis abuse, uncomplicated: Secondary | ICD-10-CM | POA: Insufficient documentation

## 2015-05-04 DIAGNOSIS — F1410 Cocaine abuse, uncomplicated: Secondary | ICD-10-CM | POA: Diagnosis not present

## 2015-05-04 DIAGNOSIS — Z880 Allergy status to penicillin: Secondary | ICD-10-CM | POA: Diagnosis not present

## 2015-05-04 DIAGNOSIS — Z3202 Encounter for pregnancy test, result negative: Secondary | ICD-10-CM | POA: Diagnosis not present

## 2015-05-04 DIAGNOSIS — Z008 Encounter for other general examination: Secondary | ICD-10-CM | POA: Diagnosis present

## 2015-05-04 HISTORY — DX: Post-traumatic stress disorder, unspecified: F43.10

## 2015-05-04 LAB — CBC
HEMATOCRIT: 44.4 % (ref 35.0–47.0)
Hemoglobin: 14.5 g/dL (ref 12.0–16.0)
MCH: 27.6 pg (ref 26.0–34.0)
MCHC: 32.7 g/dL (ref 32.0–36.0)
MCV: 84.5 fL (ref 80.0–100.0)
Platelets: 413 10*3/uL (ref 150–440)
RBC: 5.25 MIL/uL — ABNORMAL HIGH (ref 3.80–5.20)
RDW: 16 % — AB (ref 11.5–14.5)
WBC: 12.6 10*3/uL — ABNORMAL HIGH (ref 3.6–11.0)

## 2015-05-04 LAB — URINE DRUG SCREEN, QUALITATIVE (ARMC ONLY)
Amphetamines, Ur Screen: NOT DETECTED
BARBITURATES, UR SCREEN: NOT DETECTED
BENZODIAZEPINE, UR SCRN: NOT DETECTED
CANNABINOID 50 NG, UR ~~LOC~~: POSITIVE — AB
Cocaine Metabolite,Ur ~~LOC~~: POSITIVE — AB
MDMA (Ecstasy)Ur Screen: NOT DETECTED
METHADONE SCREEN, URINE: NOT DETECTED
OPIATE, UR SCREEN: NOT DETECTED
PHENCYCLIDINE (PCP) UR S: NOT DETECTED
Tricyclic, Ur Screen: NOT DETECTED

## 2015-05-04 LAB — COMPREHENSIVE METABOLIC PANEL
ALBUMIN: 4.8 g/dL (ref 3.5–5.0)
ALK PHOS: 87 U/L (ref 38–126)
ALT: 22 U/L (ref 14–54)
ANION GAP: 9 (ref 5–15)
AST: 18 U/L (ref 15–41)
BILIRUBIN TOTAL: 1.4 mg/dL — AB (ref 0.3–1.2)
BUN: 15 mg/dL (ref 6–20)
CALCIUM: 9.6 mg/dL (ref 8.9–10.3)
CO2: 22 mmol/L (ref 22–32)
CREATININE: 0.75 mg/dL (ref 0.44–1.00)
Chloride: 105 mmol/L (ref 101–111)
GFR calc non Af Amer: >60 mL/min (ref >60)
GLUCOSE: 123 mg/dL — AB (ref 65–99)
Potassium: 3.4 mmol/L — ABNORMAL LOW (ref 3.5–5.1)
Sodium: 136 mmol/L (ref 135–145)
TOTAL PROTEIN: 8.5 g/dL — AB (ref 6.5–8.1)

## 2015-05-04 LAB — ETHANOL: Alcohol, Ethyl (B): <5 mg/dL (ref <5)

## 2015-05-04 LAB — SALICYLATE LEVEL: Salicylate Lvl: <4 mg/dL (ref 2.8–30.0)

## 2015-05-04 LAB — ACETAMINOPHEN LEVEL

## 2015-05-04 LAB — POCT PREGNANCY, URINE: Preg Test, Ur: NEGATIVE

## 2015-05-04 MED ORDER — IBUPROFEN 600 MG PO TABS
600.0000 mg | ORAL_TABLET | Freq: Once | ORAL | Status: AC
  Administered 2015-05-04: 600 mg via ORAL
  Filled 2015-05-04: qty 1

## 2015-05-04 MED ORDER — LORAZEPAM 1 MG PO TABS
1.0000 mg | ORAL_TABLET | Freq: Once | ORAL | Status: AC
  Administered 2015-05-04: 1 mg via ORAL
  Filled 2015-05-04: qty 1

## 2015-05-04 NOTE — ED Notes (Signed)
Meal tray and extra drink provided.

## 2015-05-04 NOTE — ED Notes (Signed)
Pt. Noted in  room watching the tv.;. No complaints or concerns voiced. No distress or abnormal behavior noted. Will continue to monitor with security cameras. Q 15 minute rounds continue. 
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
## 2015-05-04 NOTE — [facility] Counselor (Signed)
Per Dr. Mayford KnifeWilliams, pt meets criteria for inpatient admission.  Patient has been assigned to room 306; attending will be Dr. Ardyth HarpsHernandez.

## 2015-05-04 NOTE — ED Notes (Signed)
Pt changed into scrubs and belongings put in secure locker area 

## 2015-05-04 NOTE — ED Notes (Signed)
Report received from Andrea B., RN. Pt. Alert and oriented in no distress denies SI, HI, AVH and pain.  Pt. Instructed to come to me with problems or concerns.Will continue to monitor for safety via security cameras and Q 15 minute checks. 

## 2015-05-04 NOTE — ED Notes (Signed)
Pt. To ED-[facility] from ED ambulatory without difficulty, to room #5  . Report from RN. Pt. Is alert and oriented, warm and dry in no distress. Pt. Denies SI, HI, and AVH. Pt. Calm and cooperative. Pt. Made aware of security cameras and Q15 minute rounds. Pt. Encouraged to let Nursing staff know of any concerns or needs.  
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
## 2015-05-05 ENCOUNTER — Inpatient Hospital Stay
Admit: 2015-05-05 | Discharge: 2015-05-09 | DRG: 882 | Disposition: A | Discharge status: Home / Self Care | Payer: Medicaid Other | Source: Intra-hospital | Attending: Psychiatry | Admitting: Psychiatry

## 2015-05-05 DIAGNOSIS — F429 Obsessive-compulsive disorder, unspecified: Secondary | ICD-10-CM | POA: Diagnosis present

## 2015-05-05 DIAGNOSIS — F1590 Other stimulant use, unspecified, uncomplicated: Secondary | ICD-10-CM

## 2015-05-05 DIAGNOSIS — F1290 Cannabis use, unspecified, uncomplicated: Secondary | ICD-10-CM | POA: Diagnosis present

## 2015-05-05 DIAGNOSIS — I10 Essential (primary) hypertension: Secondary | ICD-10-CM | POA: Diagnosis present

## 2015-05-05 DIAGNOSIS — J45909 Unspecified asthma, uncomplicated: Secondary | ICD-10-CM

## 2015-05-05 DIAGNOSIS — F4323 Adjustment disorder with mixed anxiety and depressed mood: Secondary | ICD-10-CM | POA: Diagnosis not present

## 2015-05-05 DIAGNOSIS — F1220 Cannabis dependence, uncomplicated: Secondary | ICD-10-CM

## 2015-05-05 DIAGNOSIS — F17210 Nicotine dependence, cigarettes, uncomplicated: Secondary | ICD-10-CM | POA: Diagnosis present

## 2015-05-05 DIAGNOSIS — F4329 Adjustment disorder with other symptoms: Secondary | ICD-10-CM

## 2015-05-05 DIAGNOSIS — Z811 Family history of alcohol abuse and dependence: Secondary | ICD-10-CM | POA: Diagnosis not present

## 2015-05-05 DIAGNOSIS — Z91410 Personal history of adult physical and sexual abuse: Secondary | ICD-10-CM

## 2015-05-05 DIAGNOSIS — F1490 Cocaine use, unspecified, uncomplicated: Secondary | ICD-10-CM | POA: Diagnosis present

## 2015-05-05 DIAGNOSIS — F4310 Post-traumatic stress disorder, unspecified: Secondary | ICD-10-CM | POA: Diagnosis present

## 2015-05-05 DIAGNOSIS — R45851 Suicidal ideations: Secondary | ICD-10-CM | POA: Diagnosis present

## 2015-05-05 DIAGNOSIS — F17200 Nicotine dependence, unspecified, uncomplicated: Secondary | ICD-10-CM

## 2015-05-05 LAB — TSH: TSH: 1.426 u[IU]/mL (ref 0.350–4.500)

## 2015-05-05 MED ORDER — ALUM & MAG HYDROXIDE-SIMETH 200-200-20 MG/5ML PO SUSP: 30.0000 mL | ORAL | Status: DC | PRN

## 2015-05-05 MED ORDER — FLUOXETINE HCL 10 MG PO CAPS
10.0000 mg | ORAL_CAPSULE | Freq: Every day | ORAL | Status: DC
  Administered 2015-05-05 – 2015-05-07 (×3): 10 mg via ORAL
  Filled 2015-05-05 (×4): qty 1

## 2015-05-05 MED ORDER — HYDROXYZINE HCL 50 MG PO TABS
50.0000 mg | ORAL_TABLET | Freq: Three times a day (TID) | ORAL | Status: DC | PRN
  Administered 2015-05-05: 50 mg via ORAL
  Filled 2015-05-05: qty 1

## 2015-05-05 MED ORDER — TRIPLE ANTIBIOTIC 3.5-400-5000 EX OINT
TOPICAL_OINTMENT | Freq: Two times a day (BID) | CUTANEOUS | Status: DC
  Administered 2015-05-06 – 2015-05-07 (×3): via TOPICAL
  Administered 2015-05-07 – 2015-05-09 (×4): 1 via TOPICAL
  Filled 2015-05-05 (×8): qty 1

## 2015-05-05 MED ORDER — HALOPERIDOL 0.5 MG PO TABS
0.5000 mg | ORAL_TABLET | Freq: Three times a day (TID) | ORAL | Status: DC
  Administered 2015-05-05 – 2015-05-07 (×8): 0.5 mg via ORAL
  Filled 2015-05-05 (×9): qty 1

## 2015-05-05 MED ORDER — LORAZEPAM 2 MG PO TABS
2.0000 mg | ORAL_TABLET | Freq: Every day | ORAL | Status: DC
  Administered 2015-05-05 – 2015-05-08 (×4): 2 mg via ORAL
  Filled 2015-05-05 (×4): qty 1

## 2015-05-05 MED ORDER — ACETAMINOPHEN 325 MG PO TABS
650.0000 mg | ORAL_TABLET | Freq: Four times a day (QID) | ORAL | Status: DC | PRN
Start: 2015-05-05 — End: 2015-05-09
  Administered 2015-05-06 – 2015-05-09 (×4): 650 mg via ORAL
  Filled 2015-05-05 (×4): qty 2

## 2015-05-05 MED ORDER — NICOTINE 21 MG/24HR TD PT24
21.0000 mg | MEDICATED_PATCH | Freq: Every day | TRANSDERMAL | Status: DC
  Administered 2015-05-05 – 2015-05-09 (×5): 21 mg via TRANSDERMAL
  Filled 2015-05-05 (×5): qty 1

## 2015-05-05 MED ORDER — ALBUTEROL SULFATE HFA 108 (90 BASE) MCG/ACT IN AERS
2.0000 | INHALATION_SPRAY | Freq: Four times a day (QID) | RESPIRATORY_TRACT | Status: DC | PRN
  Filled 2015-05-05: qty 6.7

## 2015-05-05 MED ORDER — ATENOLOL 25 MG PO TABS
12.5000 mg | ORAL_TABLET | Freq: Every day | ORAL | Status: DC
  Administered 2015-05-05 – 2015-05-07 (×3): 12.5 mg via ORAL
  Filled 2015-05-05 (×4): qty 1

## 2015-05-05 MED ORDER — MAGNESIUM HYDROXIDE 400 MG/5ML PO SUSP: 30.0000 mL | Freq: Every day | ORAL | Status: DC | PRN

## 2015-05-05 NOTE — Progress Notes (Signed)
Recreation Therapy Notes  At approximately 9:22 am, LRT attempted assessment. Patient refused stating she was tired. LRT will attempt assessment at a later time.  [name],Kaedyn Polivka M, LRT/CTRS 05/05/2015 11:28 AM

## 2015-05-05 NOTE — [facility] Group Notes (Signed)
[facility] Group Notes:  (Nursing/MHT/Case Management/Adjunct)  Date:  05/05/2015  Time:  2:10 PM  Type of Therapy:  Psychoeducational Skills  Participation Level:  Did Not Attend    Mickey Farberamela M Moses Ellison 05/05/2015, 2:10 PM

## 2015-05-05 NOTE — Progress Notes (Signed)
D:Pt was calm and compliant with treatment and medications. Husband came to visit, visit went well. Denies SI. Pt stated that she is feeling better and is happy that she decided to come here for help. Expressed some anxiety and admitted to picking at her face even though she doesn't want to. A: Support and encouragement provided, medications administered as prescribed. Area on face was cleaned and band aid applied. Q 15 minute checks maintained for safety. R: Pt receptive to treatment and cooperative. Stated she will let staff know if she cannot stop picking at her face.

## 2015-05-05 NOTE — Tx Team (Addendum)
Initial Interdisciplinary Treatment Plan   PATIENT STRESSORS: Financial difficulties Legal issue Loss of sister Marital or family conflict Occupational concerns Substance abuse   PATIENT STRENGTHS: Ability for insight General fund of knowledge Motivation for treatment/growth   PROBLEM LIST: Problem List/Patient Goals Date to be addressed Date deferred Reason deferred Estimated date of resolution  Depression 05/05/15     Current everyday smoker 05/05/15     Anxiety 05/05/15                                          DISCHARGE CRITERIA:  Improved stabilization in mood, thinking, and/or behavior  PRELIMINARY DISCHARGE PLAN: Outpatient therapy  PATIENT/FAMIILY INVOLVEMENT: This treatment plan has been presented to and reviewed with the patient, Laurie Stephens, and/or family member.  The patient and family have been given the opportunity to ask questions and make suggestions.  Laurie Stephens 05/05/2015, 1:34 AM
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
## 2015-05-05 NOTE — Progress Notes (Signed)
Patient stated she is under probation and will contact her probation officer tomorrow to notify her of the situation.

## 2015-05-05 NOTE — [facility] Group Notes (Signed)
[facility] LCSW Group Therapy   05/05/2015 11:00 am  Type of Therapy: Group Therapy   Participation Level: Did Not Attend. Patient invited to participate but declined.    Felicity Penix F. Rosina Cressler, MSW, LCSWA, LCAS   

## 2015-05-05 NOTE — ED Notes (Signed)
BEHAVIORAL HEALTH ROUNDING Patient sleeping: No. Patient alert and oriented: yes Behavior appropriate: Yes.  ; If no, describe:  Nutrition and fluids offered: Yes  Toileting and hygiene offered: Yes  Sitter present: yes Law enforcement present: Yes  

## 2015-05-05 NOTE — Progress Notes (Signed)
Recreation Therapy Notes  Date: 12.22.16 Time: 3:00 pm Location: Craft Room  Group Topic: Coping Skills/Leisure Education  Goal Area(s) Addresses:  Patient will identify things they are grateful for. Patient will verbalize understanding how being grateful can influence decision making.  Behavioral Response: Did not attend  Intervention: Grateful Wheel  Activity: Patients were given an "I Am Grateful For" worksheet and instructed to list 2-3 things they are grateful for under each category.  Education: LRT educated patients on how being aware of what they are grateful for can affect their decision making skills.  Education Outcome: Patient did not attend group.  Clinical Observations/Feedback: Patient did not attend group.  Brina Umeda M, LRT/CTRS 05/05/2015 4:15 PM 

## 2015-05-05 NOTE — Plan of Care (Signed)
Problem: Alteration in mood Goal: LTG-Pt's behavior demonstrates decreased signs of depression (Patient's behavior demonstrates decreased signs of depression to the point the patient is safe to return home and continue treatment in an outpatient setting)  Outcome: Not Progressing Pt continues to be tearful at times

## 2015-05-05 NOTE — ED Notes (Signed)
BEHAVIORAL HEALTH ROUNDING Patient sleeping: Yes.   Patient alert and oriented: yes Behavior appropriate: Yes.  ; If no, describe:  Nutrition and fluids offered: Yes  Toileting and hygiene offered: Yes  Sitter present: yes Law enforcement present: Yes  
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
## 2015-05-06 LAB — VITAMIN B12: VITAMIN B 12: 1368 pg/mL — AB (ref 180–914)

## 2015-05-06 NOTE — [facility] Group Notes (Signed)
[facility]  05/06/2015 2:53 PM  Type of Therapy:  Group Therapy  Participation Level:  Did Not Attend    Lulu Ridingngle, Kennadie Brenner T, MSW, LCSWA 05/06/2015, 2:53 PM

## 2015-05-06 NOTE — Progress Notes (Addendum)
Patient female visitor called and wanted to retrieve keys and EDT card from patient in order to access a check at home. Patient consented for female visitor to retrieve items. Nurse and security verified with patient that female visitor could receive card and keys.
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
## 2015-05-06 NOTE — Progress Notes (Signed)
Recreation Therapy Notes  Date: 12.23.16 Time: 3:00 pm Location: Craft Room  Group Topic: Coping Skills  Goal Area(s) Addresses:  Patient will participate in coping skill. Patient will verbalize emotion experienced during activity.  Behavioral Response: Did not attend  Intervention: Coloring  Activity: Patients were given coloring sheets and instructed to color while thinking about what emotions there were experiencing.  Education: LRT educated patients on healthy coping skills.  Education Outcome: Patient did not attend group.  Clinical Observations/Feedback: Patient did not attend group.  [name],[place]y M, LRT/CTRS 05/06/2015 4:30 PM

## 2015-05-06 NOTE — Plan of Care (Signed)
Problem: [facility] Participation in Recreation Therapeutic Interventions Goal: STG-Patient will demonstrate improved self esteem by identif STG: Self-Esteem - Within 4 treatment sessions, patient will verbalize at least 5 positive affirmation statements in each of 2 treatment sessions to increase self-esteem post d/c.  Outcome: Progressing Treatment Session 1; Completed 1 out of 2: At approximately 2:20 pm, LRT met with patient in patient room. Patient verbalized 5 positive affirmation statements. Patient reported it felt "good". LRT encouraged patient to continue saying positive affirmation statements. Intervention Used: I Am statements   M , LRT/CTRS 12.23.16 2:30 pm Goal: STG-Other Recreation Therapy Goal (Specify) STG: Stress Management - Within 4 treatment sessions, patient will verbalize understanding of the stress management techniques in each of 2 treatment sessions to increase stress management skills post d/c.  Outcome: Progressing Treatment Session 1; Completed 1 out of 2: At approximately 2:20 pm, LRT met with patient in patient room. LRT educated and provided patient with handouts on stress management techniques. Patient verbalized understanding. LRT encouraged patient to read over and practice the stress management techniques. Intervention Used: Stress Management handouts   M , LRT/CTRS 12.23.16 2:31 pm     
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
## 2015-05-06 NOTE — B[facility] Group Notes (Signed)
[facility] Group Notes:  (Nursing/MHT/Case Management/Adjunct)  Date:  05/06/2015  Time:  3:19 PM  Type of Therapy:  Music Therapy  Participation Level:  Did Not Attend   Laurie Stephens 05/06/2015, 3:19 PM 

## 2015-05-06 NOTE — [facility] Group Notes (Signed)
[facility]   05/06/2015 10:16 AM   Participation Quality:  Active   Mood/Affect:  Appropriate  Depression Rating:  7  Anxiety Rating:  5  Thoughts of Suicide:  No Will you contract for safety?   NA  Current AVH:  NA  Plan for Discharge/Comments:  Pt reports improvements in her depression and anxiety since being admitted. She plans to attend groups and try not to isolate herself. She states socializing with other patients have been helpful. She reports feeling better after catching up on her sleep. She plans to return home and follow up with outpt. She expressed concerns about discharging and going back to the same situation.   Transportation Means: Family   Supports: Family   Shelle Galdamez [employer] Vibhav Waddill MSW, [employer]CSWA

## 2015-05-06 NOTE — Progress Notes (Signed)
D- Patient in dayroom upon approach. Patient denies SI/ HI/AVH and pain. Contracts for safety during inpatient stay. Patient states that she feels much better now that she has taken Haldol consistently. She says that she has less anxiety and that her thoughts are clearer. Patient endorses depression but states that she is trying to remain more positive and is trying to be more social.  A- Nurse provided reassurance and helped maintain a safe environment. Every 15 minute checks completed for safety. Provided scheduled medications.  R-  Patient compliant with treatment and is visible in the milieu. Safety maintained.

## 2015-05-06 NOTE — Progress Notes (Signed)
D- Patient in room coloring during assessment. Patient denies SI/ HI/AVH and pain other than menstrual cramps . Contracts for safety during inpatient stay. Patient states that she feels much better now and that her prescribed medications are working for her.  She says that she has less anxiety and that her thoughts are clearer. Patient endorses depression but states that she is trying to remain more positive and is trying to be more social.  A-  provided reassurance, Q15 minute checks completed for safety. Provided scheduled medications.  R- Patient compliant with treatment and is visible in the milieu. Safety maintained.

## 2015-05-06 NOTE — Progress Notes (Signed)
Recreation Therapy Notes  INPATIENT RECREATION THERAPY ASSESSMENT  Patient Details Name: Laurie PangSavannah Harder MRN: [id number] DOB: 01/31/1986 Today's Date: 05/06/2015  Patient Stressors: Family, Death, Friends (Mom treats her nad and is selfish; hasn't seen her niece or nephew; sister committed suicide last Saturday; lack of suppotive friends)  Coping Skills:   Isolate, Arguments, Substance Abuse, Avoidance, Self-Injury, Exercise, Art/Dance, Music, Sports, Other (Comment) (Eat or doesn't eat; doesn't sleep; lash out)  Personal Challenges: Anger, Communication, Concentration, Problem-Solving, Relationships, Self-Esteem/Confidence, Stress Management, Time Management, Trusting Others  Leisure Interests (2+):  Music - Listen, Individual - Other (Comment) [name](Cook, take childnre places and hang out with them)  Awareness of Community Resources:  No  Community Resources:     Current Use:    If no, Barriers?:    Patient Strengths:  Helpful, being able to pull things together last minute  Patient Identified Areas of Improvement:  Stop putting herself down   Current Recreation Participation:  Nothing  Patient Goal for Hospitalization:  To geel better about herself and have better control over herself  [place]  County [place] Residence:  [place]   Current SI (including self-harm):  No  Current HI:  No  Consent to Intern Participation: N/A   [name],Avana Kreiser M, LRT/CTRS 05/06/2015, 1:02 PM

## 2015-05-07 NOTE — B[facility] Group Notes (Signed)
[facility] Group Notes:  (Nursing/MHT/Case Management/Adjunct)  Date:  05/07/2015  Time:  8:52 AM  Type of Therapy:  Psychoeducational Skills  Participation Level:  Active  Participation Quality:  Appropriate  Affect:  Appropriate  Cognitive:  Appropriate  Insight:  Appropriate and Good  Engagement in Group:  Supportive  Modes of Intervention:  Support  Summary of Progress/Problems:  Laurie Stephens 05/07/2015, 8:52 AM
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
## 2015-05-07 NOTE — Progress Notes (Signed)
Pt has been pleasant and cooperative. Pt has been med compliant. Pt denies SI and A/V hallucinations. Pt has been active on the unit 

## 2015-05-08 MED ORDER — FLUOXETINE HCL 20 MG PO CAPS
20.0000 mg | ORAL_CAPSULE | Freq: Every day | ORAL | Status: DC
  Administered 2015-05-08 – 2015-05-09 (×2): 20 mg via ORAL
  Filled 2015-05-08 (×2): qty 1

## 2015-05-08 MED ORDER — ATENOLOL 25 MG PO TABS
12.5000 mg | ORAL_TABLET | Freq: Every day | ORAL | Status: DC
  Administered 2015-05-08 – 2015-05-09 (×2): 12.5 mg via ORAL
  Filled 2015-05-08: qty 1

## 2015-05-08 MED ORDER — HYDROXYZINE HCL 50 MG PO TABS
50.0000 mg | ORAL_TABLET | Freq: Three times a day (TID) | ORAL | Status: DC | PRN
  Administered 2015-05-08 – 2015-05-09 (×2): 50 mg via ORAL
  Filled 2015-05-08 (×2): qty 1

## 2015-05-08 MED ORDER — ATENOLOL 25 MG PO TABS: 25.0000 mg | ORAL_TABLET | Freq: Every day | ORAL | Status: DC

## 2015-05-08 NOTE — Progress Notes (Signed)
Pt has been pleasant and cooperative. Pt has been med compliant. Pt denies SI and A/V hallucinations. Pt has been  seclusive to her room. Pt's mood and affect has been depressed.  

## 2015-05-08 NOTE — Progress Notes (Signed)
D: Pt denies SI/HI/AVH. Pt is pleasant and cooperative. Patien appears less anxious and she is interacting with peers and staff appropriately.  A: Pt was offered support and encouragement. Pt was given scheduled medications. Pt was encouraged to attend groups. Q 15 minute checks were done for safety.  R:Pt attends groups and interacts well with peers and staff. Pt is taking medication. Pt has no complaints.Pt receptive to treatment and safety maintained on unit.

## 2015-05-08 NOTE — [facility] Group Notes (Addendum)
[facility] Group Notes:  (Nursing/MHT/Case Management/Adjunct)  Date:  05/08/2015  Time:  9:45 PM  Type of Therapy: Holiday Wrap-up Discussion  Participation Level:  Did Not Attend  Participation Quality:  Did Not Attend  Affect:  N/A  Cognitive:  N/A  Insight:  None  Engagement in Group:  Did Not Attend  Modes of Intervention:  Discussion  Summary of Progress/Problems:  Laurie MorrowChelsea Stephens Laurie Stephens 05/08/2015, 9:45 PM

## 2015-05-08 NOTE — Plan of Care (Signed)
Problem: Alteration in mood; excessive anxiety as evidenced by: Goal: LTG-Patient's behavior demonstrates decreased anxiety (Patient's behavior demonstrates anxiety and he/she is utilizing learned coping skills to deal with anxiety-producing situations)  Outcome: Progressing Interacting appropriately with peers and staff.
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
## 2015-05-09 DIAGNOSIS — F429 Obsessive-compulsive disorder, unspecified: Secondary | ICD-10-CM | POA: Diagnosis present

## 2015-05-09 MED ORDER — FLUOXETINE HCL 20 MG PO CAPS
20.0000 mg | ORAL_CAPSULE | Freq: Once | ORAL | Status: AC
  Administered 2015-05-09: 20 mg via ORAL
  Filled 2015-05-09: qty 1

## 2015-05-09 MED ORDER — FLUOXETINE HCL 20 MG PO CAPS: 40.0000 mg | ORAL_CAPSULE | Freq: Every day | ORAL | Status: DC

## 2015-05-09 MED ORDER — FLUOXETINE HCL 40 MG PO CAPS: 40.0000 mg | ORAL_CAPSULE | Freq: Every day | ORAL | Status: DC

## 2015-05-09 MED ORDER — HALOPERIDOL 0.5 MG PO TABS: 0.5000 mg | ORAL_TABLET | Freq: Three times a day (TID) | ORAL | Status: DC | PRN

## 2015-05-09 MED ORDER — ATENOLOL 25 MG PO TABS: 12.5000 mg | ORAL_TABLET | Freq: Every day | ORAL | Status: DC

## 2015-05-09 NOTE — [facility] Group Notes (Signed)
[facility] Group Notes:  (Nursing/MHT/Case Management/Adjunct)  Date:  05/09/2015  Time:  12:29 PM  Type of Therapy:  Psychoeducational Skills  Participation Level:  Did Not Attend  Isabelle CourseWhitney R Keziyah Kneale 05/09/2015, 12:29 PM

## 2015-05-09 NOTE — Plan of Care (Signed)
Problem: Alteration in mood Goal: LTG-Patient reports reduction in suicidal thoughts (Patient reports reduction in suicidal thoughts and is able to verbalize a safety plan for whenever patient is feeling suicidal)  Outcome: Progressing Patient currently denies SI     

## 2015-05-09 NOTE — Progress Notes (Signed)
D: Patient is very bright. Continues to stay positive. States she's been working on her discharge. Denies SI/HI/AVH. Report pain in her bicep. States she was trying to work out and stay busy and pulled something. Visible on the milieu and interacts with patients. Positive impact on other patients.  A: Medication was given with education. Encouragement was provided. Medication was given for pain. Pain was reassessed. R: Patient was compliant with medication. Attending groups. She has been pleasant. Safety maintained with 15 min checks.

## 2015-05-09 NOTE — Progress Notes (Signed)
Patient denies SI/HI, denies A/V hallucinations. Patient verbalizes understanding of discharge instructions, follow up care and prescriptions. Patient given all belongings from  locker. Patient escorted out by staff, transported by family. 

## 2015-05-09 NOTE — Progress Notes (Signed)
  [facility] :  Will you be returning to the same living situation after discharge:  Yes,    At discharge, do you have transportation home?: Yes,    Do you have the ability to pay for your medications: Yes,     Release of information consent forms completed and in the chart;  Patient's signature needed at discharge.  Patient to Follow up at: Follow-up Information    [facility] Go on 05/09/2015.   Why:  Your hospital follow up appointment will be walk in, first come first serve basis. Please arrive early to minimize your wait time. Walk in hours are Monday-Friday between 8am-3pm. Please take any hospital paperwork, ID and insurance information.    Contact information:   [address] [place] [id number] [phone]       Next level of care provider has [facility] to [facility] Link:No  Safety Planning and Suicide Prevention discussed: Yes,     Have you used any form of tobacco in the last 30 days? (Cigarettes, Smokeless Tobacco, Cigars, and/or Pipes): Yes  Has patient been referred to the Quitline?: Patient refused referral  Patient has been referred for addiction treatment: Pt. refused referral  Shantese Raven, Cleda DaubSara P, MSW, LCSW 05/09/2015, 4:18 PM

## 2015-05-09 NOTE — Plan of Care (Signed)
Problem: [facility] Participation in Recreation Therapeutic Interventions Goal: STG-Patient will demonstrate improved self esteem by identif STG: Self-Esteem - Within 4 treatment sessions, patient will verbalize at least 5 positive affirmation statements in each of 2 treatment sessions to increase self-esteem post d/c.  Outcome: Completed/Met Date Met:  05/09/15 Treatment Session 2; Completed 2 out of 2: At approximately 11:50 am, LRT met with patient in patient room. Patient verbalized 5 positive affirmation statements. Patient stated, "I love it". LRT encouraged patient to continue saying positive affirmation statements. Intervention Used: I Am statements  Leonette Monarch, LRT/CTRS 12.26.16 1:47 pm Goal: STG-Other Recreation Therapy Goal (Specify) STG: Stress Management - Within 4 treatment sessions, patient will verbalize understanding of the stress management techniques in each of 2 treatment sessions to increase stress management skills post d/c.  Outcome: Completed/Met Date Met:  05/09/15 Treatment Session 2; Completed 2 out of 2: At approximately 11:50 am, LRT met with patient in patient room. Patient reported she read over and practiced the stress management techniques. Patient verbalized understanding of the techniques and reported they were helpful. LRT encouraged patient to continue practicing the stress management techniques. Intervention Used: Stress Management handouts  Leonette Monarch, LRT/CTRS 12.26.16 1:49 pm
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
## 2015-05-09 NOTE — Progress Notes (Signed)
Recreation Therapy Notes  INPATIENT RECREATION TR PLAN  Patient Details Name: Laurie Stephens MRN: [id number] DOB: 11-May-1986 Today's Date: 05/09/2015  Rec Therapy Plan Is patient appropriate for Therapeutic Recreation?: Yes Treatment times per week: At least once a week TR Treatment/Interventions: 1:1 session, Group participation (Comment) (Appropriate participation in daily recreation therapy tx)  Discharge Criteria Pt will be discharged from therapy if:: Treatment goals are met, Discharged Treatment plan/goals/alternatives discussed and agreed upon by:: Patient/family  Discharge Summary Short term goals set: See Care Plan Short term goals met: Complete Progress toward goals comments: One-to-one attended One-to-one attended: Self-esteem, stress management Reason goals not met: N/A Therapeutic equipment acquired: None Reason patient discharged from therapy: Discharge from hospital Pt/family agrees with progress & goals achieved: Yes Date patient discharged from therapy: 05/09/15   Leonette Monarch, LRT/CTRS 05/09/2015, 4:38 PM
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
## 2015-07-22 ENCOUNTER — Encounter: Payer: Self-pay | Admitting: Emergency Medicine

## 2015-07-22 ENCOUNTER — Emergency Department
Admission: EM | Admit: 2015-07-22 | Discharge: 2015-07-24 | Disposition: A | Discharge status: Other Acute Inpatient Hospital | Payer: Medicaid Other | Attending: Emergency Medicine | Admitting: Emergency Medicine

## 2015-07-22 DIAGNOSIS — F429 Obsessive-compulsive disorder, unspecified: Secondary | ICD-10-CM | POA: Insufficient documentation

## 2015-07-22 DIAGNOSIS — F4325 Adjustment disorder with mixed disturbance of emotions and conduct: Secondary | ICD-10-CM | POA: Diagnosis not present

## 2015-07-22 DIAGNOSIS — Z791 Long term (current) use of non-steroidal anti-inflammatories (NSAID): Secondary | ICD-10-CM | POA: Diagnosis not present

## 2015-07-22 DIAGNOSIS — S5012XA Contusion of left forearm, initial encounter: Secondary | ICD-10-CM | POA: Insufficient documentation

## 2015-07-22 DIAGNOSIS — X58XXXA Exposure to other specified factors, initial encounter: Secondary | ICD-10-CM | POA: Diagnosis not present

## 2015-07-22 DIAGNOSIS — Y998 Other external cause status: Secondary | ICD-10-CM | POA: Insufficient documentation

## 2015-07-22 DIAGNOSIS — F1410 Cocaine abuse, uncomplicated: Secondary | ICD-10-CM | POA: Insufficient documentation

## 2015-07-22 DIAGNOSIS — F17200 Nicotine dependence, unspecified, uncomplicated: Secondary | ICD-10-CM

## 2015-07-22 DIAGNOSIS — F1210 Cannabis abuse, uncomplicated: Secondary | ICD-10-CM | POA: Diagnosis not present

## 2015-07-22 DIAGNOSIS — Y9389 Activity, other specified: Secondary | ICD-10-CM | POA: Insufficient documentation

## 2015-07-22 DIAGNOSIS — S5011XA Contusion of right forearm, initial encounter: Secondary | ICD-10-CM | POA: Insufficient documentation

## 2015-07-22 DIAGNOSIS — F17210 Nicotine dependence, cigarettes, uncomplicated: Secondary | ICD-10-CM | POA: Diagnosis not present

## 2015-07-22 DIAGNOSIS — F1910 Other psychoactive substance abuse, uncomplicated: Secondary | ICD-10-CM

## 2015-07-22 DIAGNOSIS — F1110 Opioid abuse, uncomplicated: Secondary | ICD-10-CM | POA: Diagnosis not present

## 2015-07-22 DIAGNOSIS — Z79899 Other long term (current) drug therapy: Secondary | ICD-10-CM | POA: Insufficient documentation

## 2015-07-22 DIAGNOSIS — Y9289 Other specified places as the place of occurrence of the external cause: Secondary | ICD-10-CM | POA: Diagnosis not present

## 2015-07-22 DIAGNOSIS — Z880 Allergy status to penicillin: Secondary | ICD-10-CM | POA: Diagnosis not present

## 2015-07-22 DIAGNOSIS — I10 Essential (primary) hypertension: Secondary | ICD-10-CM | POA: Diagnosis not present

## 2015-07-22 DIAGNOSIS — F4310 Post-traumatic stress disorder, unspecified: Secondary | ICD-10-CM | POA: Diagnosis not present

## 2015-07-22 DIAGNOSIS — Z7982 Long term (current) use of aspirin: Secondary | ICD-10-CM | POA: Insufficient documentation

## 2015-07-22 DIAGNOSIS — F4329 Adjustment disorder with other symptoms: Secondary | ICD-10-CM

## 2015-07-22 DIAGNOSIS — F1590 Other stimulant use, unspecified, uncomplicated: Secondary | ICD-10-CM

## 2015-07-22 DIAGNOSIS — R4182 Altered mental status, unspecified: Secondary | ICD-10-CM | POA: Diagnosis present

## 2015-07-22 LAB — COMPREHENSIVE METABOLIC PANEL
ALBUMIN: 4.6 g/dL (ref 3.5–5.0)
ALK PHOS: 90 U/L (ref 38–126)
ALT: 30 U/L (ref 14–54)
ANION GAP: 13 (ref 5–15)
AST: 27 U/L (ref 15–41)
BUN: 21 mg/dL — ABNORMAL HIGH (ref 6–20)
CALCIUM: 9.8 mg/dL (ref 8.9–10.3)
CHLORIDE: 106 mmol/L (ref 101–111)
CO2: 20 mmol/L — AB (ref 22–32)
Creatinine, Ser: 0.88 mg/dL (ref 0.44–1.00)
GFR calc non Af Amer: >60 mL/min (ref >60)
GLUCOSE: 93 mg/dL (ref 65–99)
Potassium: 3.3 mmol/L — ABNORMAL LOW (ref 3.5–5.1)
SODIUM: 139 mmol/L (ref 135–145)
Total Bilirubin: 1.3 mg/dL — ABNORMAL HIGH (ref 0.3–1.2)
Total Protein: 8.2 g/dL — ABNORMAL HIGH (ref 6.5–8.1)

## 2015-07-22 LAB — URINE DRUG SCREEN, QUALITATIVE (ARMC ONLY)
AMPHETAMINES, UR SCREEN: NOT DETECTED
Barbiturates, Ur Screen: NOT DETECTED
Benzodiazepine, Ur Scrn: NOT DETECTED
COCAINE METABOLITE, UR ~~LOC~~: POSITIVE — AB
Cannabinoid 50 Ng, Ur ~~LOC~~: POSITIVE — AB
MDMA (ECSTASY) UR SCREEN: NOT DETECTED
METHADONE SCREEN, URINE: NOT DETECTED
OPIATE, UR SCREEN: POSITIVE — AB
Phencyclidine (PCP) Ur S: NOT DETECTED
TRICYCLIC, UR SCREEN: NOT DETECTED

## 2015-07-22 LAB — ETHANOL: Alcohol, Ethyl (B): <5 mg/dL (ref <5)

## 2015-07-22 LAB — CBC
HEMATOCRIT: 40.1 % (ref 35.0–47.0)
HEMOGLOBIN: 13.7 g/dL (ref 12.0–16.0)
MCH: 29.2 pg (ref 26.0–34.0)
MCHC: 34.2 g/dL (ref 32.0–36.0)
MCV: 85.4 fL (ref 80.0–100.0)
Platelets: 457 10*3/uL — ABNORMAL HIGH (ref 150–440)
RBC: 4.69 MIL/uL (ref 3.80–5.20)
RDW: 15.5 % — ABNORMAL HIGH (ref 11.5–14.5)
WBC: 10.5 10*3/uL (ref 3.6–11.0)

## 2015-07-22 LAB — SALICYLATE LEVEL

## 2015-07-22 LAB — ACETAMINOPHEN LEVEL

## 2015-07-22 NOTE — [facility] Counselor (Signed)
Patient presents to the ED with concerns of arm pain and symptoms of depression.  She reports recently "shooting up cocaine" and smoking weed.  She states that the arm pain is the result of the needle she using cocaine.  She reports feeling helpless and hopeless because of the drug use.  Pt denies any SI/HI and auditory/visual hallucinations.  TTS counselor offered resources for outpatient therapy.  Pt reports she will follow up with [facility].
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
## 2015-07-23 MED ORDER — LORAZEPAM 0.5 MG PO TABS
ORAL_TABLET | ORAL | Status: AC
  Administered 2015-07-23: 0.5 mg via ORAL
  Filled 2015-07-23: qty 1

## 2015-07-23 MED ORDER — LORATADINE 10 MG PO TABS
10.0000 mg | ORAL_TABLET | Freq: Every day | ORAL | Status: DC
  Filled 2015-07-23: qty 1

## 2015-07-23 MED ORDER — ATENOLOL 25 MG PO TABS
25.0000 mg | ORAL_TABLET | Freq: Every day | ORAL | Status: DC
  Filled 2015-07-23: qty 1

## 2015-07-23 MED ORDER — ACETAMINOPHEN 500 MG PO TABS
1000.0000 mg | ORAL_TABLET | ORAL | Status: AC
  Administered 2015-07-23: 1000 mg via ORAL
  Filled 2015-07-23: qty 2

## 2015-07-23 MED ORDER — LORAZEPAM 0.5 MG PO TABS
0.5000 mg | ORAL_TABLET | Freq: Once | ORAL | Status: AC
  Administered 2015-07-23: 0.5 mg via ORAL

## 2015-07-23 MED ORDER — FLUTICASONE PROPIONATE 50 MCG/ACT NA SUSP
2.0000 | Freq: Every day | NASAL | Status: DC
  Filled 2015-07-23: qty 16

## 2015-07-23 MED ORDER — FLUOXETINE HCL 20 MG PO CAPS
40.0000 mg | ORAL_CAPSULE | Freq: Every day | ORAL | Status: DC
  Administered 2015-07-24: 40 mg via ORAL
  Filled 2015-07-23 (×2): qty 2

## 2015-07-23 MED ORDER — ALBUTEROL SULFATE HFA 108 (90 BASE) MCG/ACT IN AERS
2.0000 | INHALATION_SPRAY | RESPIRATORY_TRACT | Status: DC | PRN
  Filled 2015-07-23: qty 6.7

## 2015-07-23 MED ORDER — BACITRACIN ZINC 500 UNIT/GM EX OINT
TOPICAL_OINTMENT | Freq: Two times a day (BID) | CUTANEOUS | Status: DC
  Administered 2015-07-23: 15.5556 via TOPICAL
  Filled 2015-07-23 (×2): qty 28.35

## 2015-07-23 MED ORDER — LORAZEPAM 0.5 MG PO TABS
0.5000 mg | ORAL_TABLET | Freq: Once | ORAL | Status: AC
  Administered 2015-07-23: 0.5 mg via ORAL
  Filled 2015-07-23: qty 1

## 2015-07-23 NOTE — ED Notes (Signed)
Pt resting in bed at this time. Respirations even and unlabored at this time.Will continue to monitor with 15 min safety checks. 

## 2015-07-23 NOTE — ED Notes (Signed)
Pt noted to be resting in bed with the lights off. Respirations even and unlabored at this time. No needs expressed. Will continue to monitor with 15 minute safety checks at this time.

## 2015-07-23 NOTE — ED Notes (Signed)
Pt's vitals were taken/ Pt. Was cooperative with morning procedure. Pt. Asked for beverage and was given such/ nothing else has been asked of staff.

## 2015-07-23 NOTE — ED Notes (Signed)
Pt visualized in NAD. Respirations even and unlabored at this time. Denies needs at this time. Will continue to monitor with 15 min safety checks at this time.

## 2015-07-23 NOTE — ED Notes (Signed)
Laurie Stephens, TTS at bedside. Pt alert and oriented. NAD noted. Respirations noted to be even and unlabored. Will continue to monitor with 15 min safety checks.
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
## 2015-07-23 NOTE — BH Assessment (Signed)
[facility] However, Per [facility] Charge Nurse [facility](Cliff P.) she is unable to transferred to the unit until tomorrow morning, due to current Staff to Ratio. [facility] Charge also informed Database administratorHouse Supervisor of the situation.  Writer informed ER Staff (Dr. Fanny BienQuale, ER MD; Dorinda Hillonald, ER Charge Nurse; Christen Bameonnie, ER Sectary & Toniann FailWendy, Patient's Nurse).

## 2015-07-23 NOTE — ED Notes (Signed)
Pt moved to room 20 at this time. Pt noted to be tearful, stating "My nerves just can't take it". Pt placed in room by herself. Will continue to monitor with 15 min safety checks.
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
## 2015-07-23 NOTE — ED Notes (Signed)
BEHAVIORAL HEALTH ROUNDING  Patient sleeping: No.  Patient alert and oriented: yes  Behavior appropriate: Yes. ; If no, describe:  Nutrition and fluids offered: Yes  Toileting and hygiene offered: Yes  Sitter present: not applicable, Q 15 min safety rounds and observation.  Law enforcement present: Yes ODS  

## 2015-07-23 NOTE — BH Assessment (Signed)
Assessment Completed.  Consulted with [facility], Nurse Practitioner Malachy Chamber(Takia Starkes) who recommends Psychiatric Inpatient Treatment.   ER MD (Dr. Cyril LoosenKinner) informed of the disposition and agreed.   [facility] Attending Physician/Psycharist ([place]) and will put in Admission Orders.

## 2015-07-23 NOTE — ED Notes (Signed)

## 2015-07-23 NOTE — ED Provider Notes (Signed)
Prior to discharge patient stated that she was depressed, so we have ordered TTS and psychiatry consult  Jene Everyobert Dareld Mcauliffe, MD 07/23/15 1419

## 2015-07-23 NOTE — ED Notes (Signed)

## 2015-07-23 NOTE — Discharge Instructions (Signed)
You have been seen in the Emergency Department (ED) today for substance abuse.  Please follow up with your outpatient providers as soon as possible.  Please return to the ED immediately if you have ANY thoughts of hurting yourself or anyone else, so that we may help you.  Please avoid alcohol and drug use.  Follow up with your doctor and/or therapist as soon as possible regarding today's ED  visit.   Please follow up any other recommendations and clinic appointments provided by the psychiatry team that saw you in the Emergency Department.   Polysubstance Abuse When people abuse more than one drug or type of drug it is called polysubstance or polydrug abuse. For example, many smokers also drink alcohol. This is one form of polydrug abuse. Polydrug abuse also refers to the use of a drug to counteract an unpleasant effect produced by another drug. It may also be used to help with withdrawal from another drug. People who take stimulants may become agitated. Sometimes this agitation is countered with a tranquilizer. This helps protect against the unpleasant side effects. Polydrug abuse also refers to the use of different drugs at the same time.  Anytime drug use is interfering with normal living activities, it has become abuse. This includes problems with family and friends. Psychological dependence has developed when your mind tells you that the drug is needed. This is usually followed by physical dependence which has developed when continuing increases of drug are required to get the same feeling or "high". This is known as addiction or chemical dependency. A person's risk is much higher if there is a history of chemical dependency in the family. SIGNS OF CHEMICAL DEPENDENCY  You have been told by friends or family that drugs have become a problem.  You fight when using drugs.  You are having blackouts (not remembering what you do while using).  You feel sick from using drugs but continue  using.  You lie about use or amounts of drugs (chemicals) used.  You need chemicals to get you going.  You are suffering in work performance or in school because of drug use.  You get sick from use of drugs but continue to use anyway.  You need drugs to relate to people or feel comfortable in social situations.  You use drugs to forget problems. "Yes" answered to any of the above signs of chemical dependency indicates there are problems. The longer the use of drugs continues, the greater the problems will become. If there is a family history of drug or alcohol use, it is best not to experiment with these drugs. Continual use leads to tolerance. After tolerance develops more of the drug is needed to get the same feeling. This is followed by addiction. With addiction, drugs become the most important part of life. It becomes more important to take drugs than participate in the other usual activities of life. This includes relating to friends and family. Addiction is followed by dependency. Dependency is a condition where drugs are now needed not just to get high, but to feel normal. Addiction cannot be cured but it can be stopped. This often requires outside help and the care of professionals. Treatment centers are listed in the yellow pages under: Cocaine, Narcotics, and Alcoholics Anonymous. Most hospitals and clinics can refer you to a specialized care center. Talk to your caregiver if you need help.   This information is not intended to replace advice given to you by your health care provider. Make sure  you discuss any questions you have with your health care provider.   Document Released: 12/20/2004 Document Revised: 07/23/2011 Document Reviewed: 05/05/2014 Elsevier Interactive Patient Education [employer]2016 Elsevier Inc.

## 2015-07-23 NOTE — ED Notes (Signed)
Pt continues to rest in bed at this time. Pt awakens with mild verbal stimuli at this time. Respirations noted to be even and unlabored at this time. Will continue to monitor with 15 min safety checks.

## 2015-07-24 ENCOUNTER — Encounter: Payer: Self-pay | Admitting: *Deleted

## 2015-07-24 ENCOUNTER — Inpatient Hospital Stay
Admission: AD | Admit: 2015-07-24 | Discharge: 2015-07-28 | DRG: 885 | Disposition: A | Discharge status: Home / Self Care | Payer: Medicaid Other | Source: Intra-hospital | Attending: Psychiatry | Admitting: Psychiatry

## 2015-07-24 DIAGNOSIS — Z639 Problem related to primary support group, unspecified: Secondary | ICD-10-CM | POA: Diagnosis not present

## 2015-07-24 DIAGNOSIS — F17210 Nicotine dependence, cigarettes, uncomplicated: Secondary | ICD-10-CM | POA: Diagnosis present

## 2015-07-24 DIAGNOSIS — Z62810 Personal history of physical and sexual abuse in childhood: Secondary | ICD-10-CM | POA: Diagnosis present

## 2015-07-24 DIAGNOSIS — F4310 Post-traumatic stress disorder, unspecified: Secondary | ICD-10-CM | POA: Diagnosis not present

## 2015-07-24 DIAGNOSIS — F633 Trichotillomania: Secondary | ICD-10-CM | POA: Diagnosis present

## 2015-07-24 DIAGNOSIS — F429 Obsessive-compulsive disorder, unspecified: Secondary | ICD-10-CM | POA: Diagnosis present

## 2015-07-24 DIAGNOSIS — Z7951 Long term (current) use of inhaled steroids: Secondary | ICD-10-CM

## 2015-07-24 DIAGNOSIS — F332 Major depressive disorder, recurrent severe without psychotic features: Principal | ICD-10-CM | POA: Diagnosis present

## 2015-07-24 DIAGNOSIS — G4700 Insomnia, unspecified: Secondary | ICD-10-CM | POA: Diagnosis present

## 2015-07-24 DIAGNOSIS — R45851 Suicidal ideations: Secondary | ICD-10-CM | POA: Diagnosis present

## 2015-07-24 DIAGNOSIS — F4329 Adjustment disorder with other symptoms: Secondary | ICD-10-CM | POA: Diagnosis present

## 2015-07-24 DIAGNOSIS — I10 Essential (primary) hypertension: Secondary | ICD-10-CM | POA: Diagnosis present

## 2015-07-24 DIAGNOSIS — Z7982 Long term (current) use of aspirin: Secondary | ICD-10-CM

## 2015-07-24 DIAGNOSIS — Z880 Allergy status to penicillin: Secondary | ICD-10-CM

## 2015-07-24 DIAGNOSIS — F1220 Cannabis dependence, uncomplicated: Secondary | ICD-10-CM | POA: Diagnosis present

## 2015-07-24 DIAGNOSIS — F1590 Other stimulant use, unspecified, uncomplicated: Secondary | ICD-10-CM | POA: Diagnosis present

## 2015-07-24 DIAGNOSIS — F17200 Nicotine dependence, unspecified, uncomplicated: Secondary | ICD-10-CM | POA: Diagnosis present

## 2015-07-24 DIAGNOSIS — F4323 Adjustment disorder with mixed anxiety and depressed mood: Secondary | ICD-10-CM

## 2015-07-24 DIAGNOSIS — F1290 Cannabis use, unspecified, uncomplicated: Secondary | ICD-10-CM | POA: Diagnosis present

## 2015-07-24 DIAGNOSIS — J45909 Unspecified asthma, uncomplicated: Secondary | ICD-10-CM | POA: Diagnosis present

## 2015-07-24 DIAGNOSIS — Z79899 Other long term (current) drug therapy: Secondary | ICD-10-CM | POA: Diagnosis not present

## 2015-07-24 DIAGNOSIS — F411 Generalized anxiety disorder: Secondary | ICD-10-CM | POA: Diagnosis present

## 2015-07-24 DIAGNOSIS — F1210 Cannabis abuse, uncomplicated: Secondary | ICD-10-CM | POA: Diagnosis present

## 2015-07-24 MED ORDER — ATENOLOL 25 MG PO TABS: 25.0000 mg | ORAL_TABLET | Freq: Every day | ORAL | Status: DC

## 2015-07-24 MED ORDER — ALUM & MAG HYDROXIDE-SIMETH 200-200-20 MG/5ML PO SUSP: 30.0000 mL | ORAL | Status: DC | PRN

## 2015-07-24 MED ORDER — FLUOXETINE HCL 20 MG PO CAPS: 40.0000 mg | ORAL_CAPSULE | Freq: Every day | ORAL | Status: DC

## 2015-07-24 MED ORDER — NICOTINE 21 MG/24HR TD PT24
21.0000 mg | MEDICATED_PATCH | Freq: Every day | TRANSDERMAL | Status: DC
  Administered 2015-07-24 – 2015-07-28 (×5): 21 mg via TRANSDERMAL
  Filled 2015-07-24 (×5): qty 1

## 2015-07-24 MED ORDER — ACETAMINOPHEN 325 MG PO TABS: 650.0000 mg | ORAL_TABLET | Freq: Four times a day (QID) | ORAL | Status: DC | PRN

## 2015-07-24 MED ORDER — ATENOLOL 12.5 MG HALF TABLET
12.5000 mg | ORAL_TABLET | Freq: Every day | ORAL | Status: DC
  Administered 2015-07-25 – 2015-07-28 (×4): 12.5 mg via ORAL
  Filled 2015-07-24 (×4): qty 1

## 2015-07-24 MED ORDER — MAGNESIUM HYDROXIDE 400 MG/5ML PO SUSP: 30.0000 mL | Freq: Every day | ORAL | Status: DC | PRN

## 2015-07-24 MED ORDER — ALBUTEROL SULFATE HFA 108 (90 BASE) MCG/ACT IN AERS
2.0000 | INHALATION_SPRAY | RESPIRATORY_TRACT | Status: DC | PRN
  Filled 2015-07-24: qty 6.7

## 2015-07-24 MED ORDER — LORATADINE 10 MG PO TABS: 10.0000 mg | ORAL_TABLET | Freq: Every day | ORAL | Status: DC

## 2015-07-24 MED ORDER — FLUTICASONE PROPIONATE 50 MCG/ACT NA SUSP
2.0000 | Freq: Every day | NASAL | Status: DC
  Filled 2015-07-24: qty 16

## 2015-07-24 MED ORDER — HYDROXYZINE HCL 25 MG PO TABS
25.0000 mg | ORAL_TABLET | Freq: Three times a day (TID) | ORAL | Status: DC
  Administered 2015-07-24 – 2015-07-26 (×6): 25 mg via ORAL
  Filled 2015-07-24 (×6): qty 1

## 2015-07-24 MED ORDER — FLUOXETINE HCL 20 MG PO CAPS
60.0000 mg | ORAL_CAPSULE | Freq: Every day | ORAL | Status: DC
Start: 2015-07-25 — End: 2015-07-26
  Administered 2015-07-25 – 2015-07-26 (×2): 60 mg via ORAL
  Filled 2015-07-24 (×2): qty 3

## 2015-07-24 NOTE — Progress Notes (Signed)
Patient received from ED at 0953 in scrubs.   Complains of anxiety and denies SI.  Verbalizes that  "i found out my husband was cheating on me and I relapsed on drugs and was living in my car and eating.  I have no coping skills. "  Affect a little sad. Body search and skin assessment performed.  No  Contraband found.  Multiple tattoos noted to various parts of body.  Sores noted to forehead and center of eyes.  States that she was picking at bumps that caused them.

## 2015-07-24 NOTE — Progress Notes (Signed)
2000: patient in the dayroom with staff and peers. Alert and oriented and interacting with peers appropriately. Denies SI/HI, denies hallucinations. Pleasant and cooperative.  2300: patient attended group and was active. Receive medication and had a snack then went to bed. Had no concern so far. Currently in bed resting and  Staff continue to monitor for safety and other needs.  0600: Patient slept all night and had no concern.

## 2015-07-24 NOTE — ED Provider Notes (Signed)
-----------------------------------------   7:10 AM on 07/24/2015 -----------------------------------------   Blood pressure 141/87, pulse 77, temperature 97.8 F (36.6 C), temperature source Oral, resp. rate 18, height 5\' 3"  (1.6 m), weight 174 lb (78.926 kg), last menstrual period 07/22/2015, SpO2 100 %.  The patient had no acute events since last update.  Calm and cooperative at this time.  Disposition is pending per Psychiatry/Behavioral Medicine team recommendations.     Gayla DossEryka A Viren Lebeau, MD 07/24/15 [phone]

## 2015-07-24 NOTE — Plan of Care (Signed)
Problem: Ineffective individual coping Goal: LTG: Patient will report a decrease in negative feelings Outcome: Progressing Patient denies thoughts of self harm. Pleasant and cooperative on the unit

## 2015-07-24 NOTE — Plan of Care (Signed)
Problem: Alteration in mood & ability to function due to Goal: LTG-Pt verbalizes understanding of importance of med regimen (Patient verbalizes understanding of importance of medication regimen and need to continue outpatient care and support groups)  Outcome: Progressing Patient understands her medication regime and taking  Medications voluntarily

## 2015-07-24 NOTE — Tx Team (Signed)
Initial Interdisciplinary Treatment Plan   PATIENT STRESSORS: Marital or family conflict Medication change or noncompliance Substance abuse   PATIENT STRENGTHS: Ability for insight Average or above average intelligence Communication skills Motivation for treatment/growth   PROBLEM LIST: Problem List/Patient Goals Date to be addressed Date deferred Reason deferred Estimated date of resolution  Substance abuse      "develop coping skills"      "Impulsiveness"                                           DISCHARGE CRITERIA:  Improved stabilization in mood, thinking, and/or behavior  PRELIMINARY DISCHARGE PLAN: Return to previous living arrangement  PATIENT/FAMIILY INVOLVEMENT: This treatment plan has been presented to and reviewed with the patient, Laurie Stephens, and/or family member,  .  The patient and family have been given the opportunity to ask questions and make suggestions.  Renatta Shrieves B 07/24/2015, 12:49 PM

## 2015-07-24 NOTE — [facility] Group Notes (Signed)
[facility] Group Notes:  (Nursing/MHT/Case Management/Adjunct)  Date:  07/24/2015  Time:  9:26 PM  Type of Therapy:  Group Therapy  Participation Level:  Active  Participation Quality:  Appropriate  Affect:  Appropriate  Cognitive:  Appropriate  Insight:  Appropriate  Engagement in Group:  Engaged  Modes of Intervention:  Discussion  Summary of Progress/Problems:  Laurie Stephens 07/24/2015, 9:26 PM

## 2015-07-24 NOTE — ED Notes (Signed)
Pt is awake and alert this evening. Pt mood is anxious and her affect is sad. Pt denies SI/HI and AVH at this time. Writer applied Bacitracin and band aide to forehead wound, meal provided and 15 minute checks are ongoing for safety.

## 2015-07-24 NOTE — Plan of Care (Signed)
Problem: Alteration in mood & ability to function due to Goal: LTG-Pt reports reduction in suicidal thoughts (Patient reports reduction in suicidal thoughts and is able to verbalize a safety plan for whenever patient is feeling suicidal)  Outcome: Progressing Patient reports that her depression is improving, that she is developing coping skills

## 2015-07-24 NOTE — [facility] Suicide Risk Assessment (Signed)
[facility] Admission Suicide Risk Assessment   Nursing information obtained from:    Demographic factors:    Current Mental Status:    Loss Factors:    Historical Factors:    Risk Reduction Factors:     Total Time spent with patient: 1 hour Principal Problem: Adjustment disorder with mixed emotional features Diagnosis:   Patient Active Problem List   Diagnosis Date Noted  . 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 Suicidal ideation [R45.851] 07/24/2015  . OCD (obsessive compulsive disorder) [F42.9] 05/09/2015  . PTSD (post-traumatic stress disorder) [F43.10] 05/05/2015  . Adjustment disorder with mixed emotional features [F43.23] 05/05/2015  . Stimulant use disorder (HCC) (cocaine) [F15.90] 05/05/2015  . Tobacco use disorder [F17.200] 05/05/2015  . HTN (hypertension) [I10] 05/05/2015  . Asthma [J45.909] 05/05/2015  . Cannabis use disorder, moderate, dependence (HCC) [F12.20] 05/05/2015   Subjective Data: Patient reported thoughts of wishing she were not here but not actively having suicidal ideation or plan or intent. No current psychotic symptoms. Anxious and depressed and negative but cooperative with treatment.  Continued Clinical Symptoms:  Alcohol Use Disorder Identification Test Final Score (AUDIT): 2 The "Alcohol Use Disorders Identification Test", Guidelines for Use in Primary Care, Second Edition.  World Science writer [facility]). Score between 0-7:  no or low risk or alcohol related problems. Score between 8-15:  moderate risk of alcohol related problems. Score between 16-19:  high risk of alcohol related problems. Score 20 or above:  warrants further diagnostic evaluation for alcohol dependence and treatment.   CLINICAL FACTORS:   Depression:   Comorbid alcohol abuse/dependence Impulsivity Alcohol/Substance Abuse/Dependencies   Musculoskeletal: Strength & Muscle Tone: within normal limits Gait & Station: normal Patient leans: N/A  Psychiatric Specialty Exam: ROS   Blood pressure 136/82, pulse 94, temperature 98.6 F (37 C), temperature source Oral, resp. rate 18, height  (1.6 m), weight 73.936 kg (163 lb), last menstrual period 07/22/2015, SpO2 99 %.Body mass index is 28.88 kg/(m^2).  General Appearance: Disheveled  Eye Solicitor::  Fair  Speech:  Clear and Coherent  Volume:  Normal  Mood:  Negative  Affect:  Flat  Thought Process:  Goal Directed  Orientation:  Full (Time, Place, and Person)  Thought Content:  Negative  Suicidal Thoughts:  No  Homicidal Thoughts:  No  Memory:  Immediate;   Good Recent;   Good Remote;   Good  Judgement:  Fair  Insight:  Fair  Psychomotor Activity:  Decreased  Concentration:  Fair  Recall:  [employer] of Knowledge:Fair  Language: Fair  Akathisia:  No  Handed:  Right  AIMS (if indicated):     Assets:  Communication Skills Desire for Improvement Financial Resources/Insurance Housing Physical Health Resilience  Sleep:     Cognition: WNL  ADL's:  Intact    COGNITIVE FEATURES THAT CONTRIBUTE TO RISK:  Loss of executive function    SUICIDE RISK:   Minimal: No identifiable suicidal ideation.  Patients presenting with no risk factors but with morbid ruminations; may be classified as minimal risk based on the severity of the depressive symptoms  PLAN OF CARE: Patient is currently expressing depression and anxiety but no suicidal thoughts and has very good reasons not to kill her self. Devoted to her family has some hopefulness for the future. Not psychotic not agitated right now. Cooperative with treatment. She will continue 15 minute checks and be treated for her psychiatric symptoms and engaged in appropriate groups and further daily evaluation.  I certify that inpatient services furnished can  reasonably be expected to improve the patient's condition.   Mordecai RasmussenJohn Bowie Delia, MD 07/24/2015, 2:40 PM

## 2015-07-24 NOTE — [facility] Group Notes (Signed)
[facility] LCSW Group Therapy  07/24/2015 2:10 PM  Type of Therapy:  Group Therapy  Participation Level:  Did Not Attend  Modes of Intervention:  Discussion, Education, Socialization and Support  Summary of Progress/Problems:Self esteem: Patients discussed self esteem and how it impacts them. They discussed what aspects in their lives has influenced their self esteem. They were challenged to identify changes that are needed in order to improve self esteem.    Austen Wygant L Nastassia Bazaldua MSW, LCSWA  07/24/2015, 2:10 PM   

## 2015-07-24 NOTE — ED Notes (Signed)
Pt vol adm to beh med at [facility]

## 2015-07-24 NOTE — ED Notes (Signed)
Pt has several abrasion looking wounds on her face from self picking but is not doing that here. She is aware she is to be adm to [facility] and she is agreeable with that plan
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
## 2015-07-24 NOTE — B[facility] Counselor (Signed)
This Clinical research associatewriter spoke with Charge RN: Baird LyonsCasey [phone] who reports they are "capped at 15 patients". This pt is scheduled to be transferred to unit during next shift per Baird Lyonsasey, RN due to staff ratio.

## 2015-07-24 NOTE — ED Notes (Signed)
Attempted to call unit about bed status , waiting for update

## 2015-07-24 NOTE — [facility] Group Notes (Signed)
[facility] Group Notes:  (Nursing/MHT/Case Management/Adjunct)  Date:  07/24/2015  Time:  9:27 PM  Type of Therapy:  Group Therapy  Participation Level:  Active  Participation Quality:  Appropriate  Affect:  Appropriate  Cognitive:  Appropriate  Insight:  Appropriate  Engagement in Group:  Engaged  Modes of Intervention:  Discussion  Summary of Progress/Problems:  [name] 07/24/2015, 9:27 PM

## 2015-07-24 NOTE — ED Notes (Signed)

## 2015-07-24 NOTE — Progress Notes (Signed)
[facility] Dr. Toni Amendlapacs.  Attending Physician will be Dr. Jennet MaduroPucilowska.   [facility] [place].   Intake Paper Work has been signed and placed on patient chart.  ER staff is aware of the admission [facility](  [facility].; Dr. Scotty CourtStafford, ER MD; [place] Patient's Nurse & Dennie BiblePat Patient Access).   07/24/2015 Cheryl FlashNicole Cyrah Mclamb, MS, NCC, LPCA Therapeutic Triage Specialist
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
## 2015-07-24 NOTE — ED Notes (Signed)
Attempted to call report x 2 no answer and then told all nurses giving meds

## 2015-07-25 ENCOUNTER — Encounter: Payer: Self-pay | Admitting: Psychiatry

## 2015-07-25 MED ORDER — PRAZOSIN HCL 1 MG PO CAPS
2.0000 mg | ORAL_CAPSULE | Freq: Two times a day (BID) | ORAL | Status: DC
  Administered 2015-07-25 – 2015-07-28 (×7): 2 mg via ORAL
  Filled 2015-07-25 (×7): qty 2

## 2015-07-25 MED ORDER — RISPERIDONE 1 MG PO TABS
1.0000 mg | ORAL_TABLET | Freq: Every day | ORAL | Status: DC
  Administered 2015-07-25: 1 mg via ORAL
  Filled 2015-07-25: qty 1

## 2015-07-25 MED ORDER — FLUVOXAMINE MALEATE 50 MG PO TABS
50.0000 mg | ORAL_TABLET | Freq: Every day | ORAL | Status: DC
  Administered 2015-07-25: 50 mg via ORAL
  Filled 2015-07-25: qty 1
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
## 2015-07-25 NOTE — Plan of Care (Signed)
Problem: Alteration in mood & ability to function due to Goal: LTG-Patient demonstrates decreased signs of withdrawal (Patient demonstrates decreased signs of withdrawal to the point the patient is safe to return home and continue treatment in an outpatient setting)  Outcome: Not Progressing Verbalized severe anxiety ,patient on visteril.

## 2015-07-25 NOTE — [facility] Group Notes (Signed)
[facility] Group Notes:  (Nursing/MHT/Case Management/Adjunct)  Date:  07/25/2015  Time:  9:22 PM  Type of Therapy:  Group Therapy  Participation Level:  Active  Participation Quality:  Appropriate  Affect:  Appropriate  Cognitive:  Appropriate  Insight:  Appropriate  Engagement in Group:  Engaged  Modes of Intervention:  Discussion  Summary of Progress/Problems:  Laurie Stephens 07/25/2015, 9:22 PM

## 2015-07-25 NOTE — Progress Notes (Signed)
Recreation Therapy Notes  At approximately 2:10 pm, LRT attempted assessment for a second time. Patient requested for assessment to be completed tomorrow. LRT will attempt assessment again tomorrow.  [name],Leanora Murin M, LRT/CTRS 07/25/2015 2:36 PM

## 2015-07-25 NOTE — Progress Notes (Signed)
Recreation Therapy Notes  At approximately 11:25 am, LRT attempted assessment. Patient stated she was tired from the medicine and requested for LRT to come back later.  [name],Emmali Karow M, LRT/CTRS 07/25/2015 11:41 AM

## 2015-07-25 NOTE — [facility] Group Notes (Signed)
[facility] LCSW Aftercare Discharge Planning Group Note  07/25/2015 9:30 AM  Participation Quality: Did Not Attend. Patient invited to participate but declined.   [name] F. Shanna Un, MSW, LCSWA, LCAS   
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
## 2015-07-25 NOTE — [facility] Group Notes (Signed)
[facility] Group Notes:  (Nursing/MHT/Case Management/Adjunct)  Date:  07/25/2015  Time:  12:37 PM  Type of Therapy:  Psychoeducational Skills  Participation Level:  Did Not Attend  Laurie Stephens 07/25/2015, 12:37 PM
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
## 2015-07-25 NOTE — Progress Notes (Signed)
Patient verbalized anxiety & depression.Positive for passive suicidal ideations,contracts for safety.stayed in room states "I don't like to be around people and I am trying to develop my coping skills."Compliant with medications.Appetite & energy level good.

## 2015-07-25 NOTE — Progress Notes (Signed)
Recreation Therapy Notes  Date: 03.13.17 Time: 3:00 pm Location: Craft Room  Group Topic: Wellness  Goal Area(s) Addresses:  Patient will identify at least one item per dimension of health. Patient will examine areas they are deficient in.  Behavioral Response: Did not attend  Intervention: 6 Dimensions of Health  Activity: Patients were given a worksheet with the definitions of the 6 Dimensions of Health. Patients were given a pie chart with the 6 dimensions of healthy in each section and instructed to write 2-3 things they were currently doing in each section.  Education: LRT educated patients on ways they can increase each area.  Education Outcome: Patient did not attend group.  Clinical Observations/Feedback: Patient did not attend group.  [name],Ayra Hodgdon M, LRT/CTRS 07/25/2015 4:08 PM

## 2015-07-26 DIAGNOSIS — F332 Major depressive disorder, recurrent severe without psychotic features: Secondary | ICD-10-CM | POA: Diagnosis present

## 2015-07-26 MED ORDER — FLUVOXAMINE MALEATE 50 MG PO TABS: 100.0000 mg | ORAL_TABLET | Freq: Every day | ORAL | Status: DC

## 2015-07-26 MED ORDER — FLUOXETINE HCL 20 MG PO CAPS
40.0000 mg | ORAL_CAPSULE | Freq: Every day | ORAL | Status: DC
  Administered 2015-07-27: 40 mg via ORAL
  Filled 2015-07-26: qty 2

## 2015-07-26 MED ORDER — HYDROXYZINE HCL 25 MG PO TABS
25.0000 mg | ORAL_TABLET | Freq: Once | ORAL | Status: AC
  Administered 2015-07-26: 25 mg via ORAL
  Filled 2015-07-26: qty 1

## 2015-07-26 MED ORDER — FLUVOXAMINE MALEATE 50 MG PO TABS
50.0000 mg | ORAL_TABLET | Freq: Every day | ORAL | Status: DC
  Administered 2015-07-26: 50 mg via ORAL
  Filled 2015-07-26: qty 1

## 2015-07-26 NOTE — Tx Team (Signed)
Interdisciplinary Treatment Plan Update (Adult)  Date:  07/26/2015 Time Reviewed:  5:22 PM  Progress in Treatment: Attending groups: No. Participating in groups:  No. Taking medication as prescribed:  Yes. Tolerating medication:  Yes. Family/Significant othe contact made:  No, will contact:    Patient understands diagnosis:  Yes. Discussing patient identified problems/goals with staff:  No. Medical problems stabilized or resolved:  Yes. Denies suicidal/homicidal ideation: No. Issues/concerns per patient self-inventory:  No. Other:  New problem(s) identified: No, Describe:     Discharge Plan or Barriers: Pt will discharge to home and follow up with [facility]  Reason for Continuation of Hospitalization: Anxiety Depression Medication stabilization  Comments:Laurie Stephens reports minimal improvement. She still depressed, anxious, and suicidal. She is able to contract for safety in the hospital she is back in bed today and difficult to interview. She might be too sleepy from Luvox that we started last night that the patient believes that she is just catching up on her sleep. She denies psychotic symptoms. Sleep and appetite are fair. She accepts medications and tolerates them well. There is minimal group participation so far.  Estimated length of stay:2 days  New goal(s):  Review of initial/current patient goals per problem list:   1.  Goal(s):Pt will participate in aftercare plan.  Met:  No  Target date:dsicharge  As evidenced by:Plan for housing and psychiatric follow up being identified  2.  Goal (s):Pt will exhibit decreased depressive symptoms and suicidal ideation.  Met:  No  Target date:discharge  As evidenced by:Pt will utilize self-rating of depression at 3 or below and demonstrate decreased signs of depression OR be deemed stable by MD.    Attendees: Patient:  Laurie Stephens 3/14/20175:22 PM  Family:   3/14/20175:22 PM  Physician:  Jolanta Pucilowska 3/14/20175:22  PM  Nursing:   Jennifer RN 3/14/20175:22 PM  Case Manager:   3/14/20175:22 PM  Counselor:   , LCSW 3/14/20175:22 PM  Other:  Beth Greene, LRT 3/14/20175:22 PM  Other:   3/14/20175:22 PM  Other:   3/14/20175:22 PM  Other:  3/14/20175:22 PM  Other:  3/14/20175:22 PM  Other:  3/14/20175:22 PM  Other:  3/14/20175:22 PM  Other:  3/14/20175:22 PM  Other:  3/14/20175:22 PM  Other:   3/14/20175:22 PM   Scribe for Treatment Team:   ,  P, 07/26/2015, 5:22 PM, MSW, LCSW  
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
## 2015-07-26 NOTE — Progress Notes (Signed)
D:  Patient is alert and oriented on the unit this shift.  Patient did not attend group today today.  Patient denies suicidal ideation, homicidal ideation, auditory or visual hallucinations at the present time.  Patient reports that she is overwhelmingly tired today and her appetite is very increased.  Patient is spending the majority of the shift in her bed.  Patient states "I just can't get up and get going."  A:  Scheduled medications are administered to patient as per MD orders.  Emotional support and encouragement are provided.  Patient is maintained on q.15 minute safety checks.  Patient is informed to notify staff with questions or concerns. R:  No adverse medication reactions are noted.  Patient is cooperative with medication administration and treatment plan today.  Patient is receptive, calm and cooperative on the unit at this time.  Patient interacts well with others on the unit this shift.  Patient contracts for safety at this time.  Patient remains safe at this time.

## 2015-07-26 NOTE — Progress Notes (Signed)
Recreation Therapy Notes  Date: 03.14.17 Time: 3:00 pm Location: Craft Room  Group Topic: Self-expression  Goal Area(s) Addresses:  Patient will be able to identify a color that represents each emotion. Patient will verbalize benefit of using art as a means of self-expression. Patient will verbalize one positive emotion experienced while participating in group.  Behavioral Response: Did not attend  Intervention: The Colors Within Me  Activity: Patients were given a blank face worksheet and instructed to pick a color for each emotion they were feeling and to show how much of that emotion they were feeling on the face.  Education: LRT educated patients on other forms of self-expression.  Education Outcome: Patient did not attend group.   Clinical Observations/Feedback: Patient did not attend group.  Derris Millan M, LRT/CTRS 07/26/2015 4:17 PM 

## 2015-07-26 NOTE — Plan of Care (Signed)
Problem: Alteration in mood & ability to function due to Goal: STG-Patient will attend groups Outcome: Progressing Patient is attending groups currently on the unit

## 2015-07-26 NOTE — Progress Notes (Signed)
D: Pt seen in the milieu this evening interacting appropriately with peers. She denies SI/HI/AVH at this time. Denies pain. Pt states "I like being here, it's safe here." Pt has sores on her forehead which she states are a result of her picking at bumps. Pt provided with band-aid for sores. A: Emotional support and encouragement provided. Medications administered with education. q15 minute safety checks maintained. R: Pt remains free from harm. Will continue to monitor.

## 2015-07-26 NOTE — Plan of Care (Signed)
Problem: Ineffective individual coping Goal: LTG: Patient will report a decrease in negative feelings Outcome: Progressing Patient reports feeling better today than she did yesterday     

## 2015-07-26 NOTE — [facility] Group Notes (Signed)
[facility] LCSW Group Therapy   07/26/2015 1pm  Type of Therapy: Group Therapy   Participation Level: Did Not Attend. Patient invited to participate but declined.    Azam Gervasi F. Nikolis Berent, MSW, LCSWA, LCAS   

## 2015-07-26 NOTE — Progress Notes (Signed)
Recreation Therapy Notes  At approximately 11:15 am, LRT attempted assessment. Patient sleeping.  [name],Greysyn Vanderberg M, LRT/CTRS 07/26/2015 11:35 AM

## 2015-07-26 NOTE — Plan of Care (Signed)
Problem: Alteration in mood & ability to function due to Goal: LTG-Pt reports reduction in suicidal thoughts (Patient reports reduction in suicidal thoughts and is able to verbalize a safety plan for whenever patient is feeling suicidal)  Outcome: Progressing Pt denies SI at this time  Goal: STG-Patient will comply with prescribed medication regimen (Patient will comply with prescribed medication regimen)  Outcome: Progressing Pt taking medications as prescribed     
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
## 2015-07-26 NOTE — [facility] Group Notes (Signed)
[facility] Group Notes:  (Nursing/MHT/Case Management/Adjunct)  Date:  07/26/2015  Time:  3:01 PM  Type of Therapy:  Psychoeducational Skills  Participation Level:  Did Not Attend  Lynelle SmokeCara Travis [facility] 07/26/2015, 3:01 PM

## 2015-07-27 MED ORDER — HYDROXYZINE HCL 25 MG PO TABS
25.0000 mg | ORAL_TABLET | Freq: Three times a day (TID) | ORAL | Status: DC | PRN
  Administered 2015-07-27 – 2015-07-28 (×4): 25 mg via ORAL
  Filled 2015-07-27 (×4): qty 1

## 2015-07-27 MED ORDER — FLUVOXAMINE MALEATE 50 MG PO TABS
100.0000 mg | ORAL_TABLET | Freq: Every day | ORAL | Status: DC
  Administered 2015-07-27: 100 mg via ORAL
  Filled 2015-07-27: qty 2

## 2015-07-27 NOTE — [facility] Group Notes (Signed)
[facility]   07/27/2015 9:15 AM  ?  Participation Quality: Alert, Appropriate and Oriented   Mood/Affect: Depressed and Flat   Depression Rating: 7  Anxiety Rating: 6  Thoughts of Suicide: Pt denies SI/HI   Will you contract for safety? Yes   Current AVH: Pt denies   Plan for Discharge/Comments: Pt attended discharge planning group and actively participated in group. CSW provided pt with today's workbook. Pt shared with the group that her goal upon discharge was to focus on her artwork and her kids.  Pt shared she looked forward to driving the new car she has recently purchased.  Pt presented as calm.  Pt was polite and cooperative with the CSW and other group members and focused and attentive to the topics discussed and the sharing of others.   Transportation Means: Pt reports access to transportation   Supports: No lists [facility] as her support.    Dorothe PeaJonathan F. Trica Usery, MSW, LCSWA, LCAS

## 2015-07-27 NOTE — Progress Notes (Signed)
Pt presents with sad affect but brightens with approach. Denies SI, HI, AVH. No negative behaviors noted. Pt reports being excited about new job opportunity. Med and group compliant. Appropriate with staff and peers.  Encouragement and support offered.  Pt receptive and remains safe on unit with q 15 min checks.

## 2015-07-27 NOTE — Progress Notes (Signed)
Recreation Therapy Notes  Date: 03.15.17 Time: 3:00 pm Location: Craft Room  Group Topic: Self-esteem  Goal Area(s) Addresses:  Patient will write at least one positive trait. Patient will verbalize benefit of having a healthy self-esteem.  Behavioral Response: Did not attend  Intervention: I Am  Activity: Patients were given a worksheet with the letter I on it and instructed to write as many positive traits inside the letter.  Education: LRT educated patients on ways they can increase their self-esteem.  Education Outcome: Patient did not attend group.  Clinical Observations/Feedback: Patient did not attend group.  [name],Diem Dicocco M, LRT/CTRS 07/27/2015 4:36 PM

## 2015-07-27 NOTE — [facility] Group Notes (Signed)
[facility] Group Notes:  (Nursing/MHT/Case Management/Adjunct)  Date:  07/27/2015  Time:  3:26 PM  Type of Therapy:  Psychoeducational Skills  Participation Level:  Active  Participation Quality:  Appropriate, Attentive and Sharing  Affect:  Appropriate  Cognitive:  Appropriate  Insight:  Appropriate  Engagement in Group:  Engaged  Modes of Intervention:  Discussion, Education and Support  Summary of Progress/Problems:  Lynelle SmokeCara Travis [facility] 07/27/2015, 3:26 PM

## 2015-07-27 NOTE — Progress Notes (Signed)
D: Pt is pleasant and cooperative this evening. Denies SI/HI/AVH at this time. Denies pain. Pt reports that she felt overly sedated during the day today and was unable to attend groups. Pt verbalizes understanding that her medications are being adjusted. Pt reports decreased anxiety. A: Emotional support and encouragement provided. Medications administered as prescribed. q15 minute safety checks maintained. R: Pt remains free from harm. Will continue to monitor.

## 2015-07-27 NOTE — Plan of Care (Signed)
Problem: [facility] Participation in Recreation Therapeutic Interventions Goal: STG-Patient will identify at least five coping skills for ** STG: Coping Skills - Within 4 treatment sessions, patient will verbalize at least 5 coping skills for substance abuse in each of 2 treatment sessions to decrease substance abuse post d/c.  Outcome: Progressing Treatment Session 1; Completed 1 out of 2: At approximately 12:35 pm, LRT met with patient in craft room. Patient verbalized 5 coping skills for substance abuse. LRT educated patient in leisure and why it is important to implement into her schedule. LRT educated and provided patient with blank schedules to help her plan her day and try to avoid using substances. LRT educated patient on healthy support systems. Intervention Used: Coping Skills worksheet  Leonette Monarch, LRT/CTRS 03.15.17 2:45 pm  Problem: [facility] Participation in Recreation Therapeutic Interventions Goal: STG-Patient will identify at least five coping skills for ** STG: Coping Skills - Within 4 treatment sessions, Patient will verbalize at least 5 new coping skills in each of 2 treatment sessions to increase healthy coping skills post d/c.  Outcome: Progressing Treatment Session 1; Completed 1 out of 2: At approximately 12:35 pm, LRT met with patient in craft room. Patient verbalized 20 coping skills. LRT encouraged patient to continue thinking of healthy coping skills. Intervention Used: Coping Skills worksheet  Leonette Monarch, LRT/CTRS 03.15.17 2:47 pm

## 2015-07-27 NOTE — Plan of Care (Signed)
Problem: Alteration in mood & ability to function due to Goal: LTG-Pt reports reduction in suicidal thoughts (Patient reports reduction in suicidal thoughts and is able to verbalize a safety plan for whenever patient is feeling suicidal)  Outcome: Progressing Pt denies SI at this time     

## 2015-07-27 NOTE — Plan of Care (Signed)
Problem: Alteration in mood & ability to function due to Goal: LTG-Patient demonstrates decreased signs of withdrawal (Patient demonstrates decreased signs of withdrawal to the point the patient is safe to return home and continue treatment in an outpatient setting)  Outcome: Progressing Pt does not show signs of withdrawal at this time. Goal: STG-Patient will comply with prescribed medication regimen (Patient will comply with prescribed medication regimen)  Outcome: Progressing Pt taking medications as prescribed.

## 2015-07-27 NOTE — [facility] Group Notes (Addendum)
[facility] LCSW Group Therapy   07/27/2015 1:15 PM   Type of Therapy: Group Therapy   Participation Level: Active   Participation Quality: Attentive, Sharing and Supportive   Affect: Depressed and Flat   Cognitive: Alert and Oriented   Insight: Developing/Improving and Engaged   Engagement in Therapy: Developing/Improving and Engaged   Modes of Intervention: Clarification, Confrontation, Discussion, Education, Exploration, Limit-setting, Orientation, Problem-solving, Rapport Building, Dance movement psychotherapisteality Testing, Socialization and Support   Summary of Progress/Problems: The topic for group today was emotional regulation. This group focused on both positive and negative emotion identification and allowed  group members to process ways to identify feelings, regulate negative emotions, and find healthy ways to manage internal/external emotions. Group members were asked to reflect on a time when their reaction to an emotion led to a negative outcome and explored how alternative responses using emotion regulation would have benefited them. Group members were also asked to discuss a time when emotion regulation was utilized when a negative emotion was experienced. Pt shared that she reacted to news that her boyfriend had cheated on her by relapsing on heroin and that she identified that this reaction to her emotions resulted in a negative outcome for the pt.  Pt identified that the emotion she felt was betrayal.  Pt shared with the group that in the future she will seek to think before she reacts and thus, avoid a negative outcome.  Pt was polite and cooperative with the CSW and other group members and focused and attentive to the topics discussed and the sharing of others.     Dorothe PeaJonathan F. Nabil Bubolz, MSW, LCSWA, LCAS

## 2015-07-27 NOTE — [facility] Group Notes (Signed)
[facility] Group Notes:  (Nursing/MHT/Case Management/Adjunct)  Date:  07/27/2015  Time:  10:36 PM  Type of Therapy:  Group Therapy  Participation Level:  Active  Participation Quality:  Appropriate  Affect:  Appropriate  Cognitive:  Appropriate  Insight:  Appropriate  Engagement in Group:  Engaged  Modes of Intervention:  n/a  Summary of Progress/Problems:  Laurie Stephens 07/27/2015, 10:36 PM 
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
## 2015-07-27 NOTE — Progress Notes (Signed)
D: Pt denies SI/HI/AVH. Pt is pleasant and cooperative. Pt  Stated she was feeling better, se went to groups and believes the medication helping.  A: Pt was offered support and encouragement. Pt was given scheduled medications. Pt was encourage to attend groups. Q 15 minute checks were done for safety.   R:Pt attends groups and interacts well with peers and staff. Pt is taking medication. Pt has no complaints.Pt receptive to treatment and safety maintained on unit.

## 2015-07-27 NOTE — Plan of Care (Signed)
Problem: Alteration in mood & ability to function due to Goal: LTG-Pt reports reduction in suicidal thoughts (Patient reports reduction in suicidal thoughts and is able to verbalize a safety plan for whenever patient is feeling suicidal)  Outcome: Progressing Denies SI     

## 2015-07-28 MED ORDER — ATENOLOL 25 MG PO TABS: 12.5000 mg | ORAL_TABLET | Freq: Every day | ORAL | Status: AC

## 2015-07-28 MED ORDER — PRAZOSIN HCL 2 MG PO CAPS: 2.0000 mg | ORAL_CAPSULE | Freq: Two times a day (BID) | ORAL | Status: DC

## 2015-07-28 MED ORDER — HYDROXYZINE HCL 25 MG PO TABS
25.0000 mg | ORAL_TABLET | Freq: Three times a day (TID) | ORAL | Status: DC | PRN
Start: 2015-07-28 — End: 2016-03-31

## 2015-07-28 MED ORDER — FLUVOXAMINE MALEATE 100 MG PO TABS: 100.0000 mg | ORAL_TABLET | Freq: Every day | ORAL | Status: AC

## 2015-07-28 NOTE — Plan of Care (Signed)
Problem: [facility] Participation in Recreation Therapeutic Interventions Goal: STG-Patient will identify at least five coping skills for ** STG: Coping Skills - Within 4 treatment sessions, patient will verbalize at least 5 coping skills for substance abuse in each of 2 treatment sessions to decrease substance abuse post d/c.  Outcome: Completed/Met Date Met:  07/28/15 Treatment Session 2; Completed 2 out of 2: At approximately 4:45 pm, LRT met with patient in patient room. Patient verbalized 5 coping skills for substance abuse. LRT encouraged patient to participate in leisure activities.  Intervention Used: Coping Skills worksheet  Leonette Monarch, LRT/CTRS 03.16.17 4:51 pm  Problem: [facility] Participation in Recreation Therapeutic Interventions Goal: STG-Patient will identify at least five coping skills for ** STG: Coping Skills - Within 4 treatment sessions, Patient will verbalize at least 5 new coping skills in each of 2 treatment sessions to increase healthy coping skills post d/c.  Outcome: Completed/Met Date Met:  07/28/15 Treatment Session 2; Completed 2 out of 2: At approximately 4:45 pm, LRT met with patient in patient room. Patient verbalized 5 new coping skills. LRT encouraged patient to use the healthy coping skills and continue to think of them. Intervention Used: Coping Skills worksheet  Leonette Monarch, LRT/CTRS 03.16.17 4:52 pm

## 2015-07-28 NOTE — Plan of Care (Signed)
Problem: Alteration in mood & ability to function due to Goal: STG-Patient will attend groups Outcome: Progressing Patient reports she is going to attend groups today on the unit

## 2015-07-28 NOTE — [facility] Group Notes (Signed)
[facility] LCSW Group Therapy   07/28/2015 11am  Type of Therapy: Group Therapy   Participation Level: Did Not Attend. Patient invited to participate but declined.    Corinthian Kemler F. Jaycee Pelzer, MSW, LCSWA, LCAS   

## 2015-07-28 NOTE — Tx Team (Signed)
Interdisciplinary Treatment Plan Update (Adult)  Date:  07/28/2015 Time Reviewed:  12:39 PM  Progress in Treatment: Attending groups: No. Participating in groups:  No. Taking medication as prescribed:  Yes. Tolerating medication:  Yes. Family/Significant othe contact made:  No, will contact:  CSW attempted to call boyfriend Patient understands diagnosis:  Yes. Discussing patient identified problems/goals with staff:  Yes. Medical problems stabilized or resolved:  Yes. Denies suicidal/homicidal ideation: Yes. Issues/concerns per patient self-inventory:  Yes. Other:  New problem(s) identified: No, Describe:  NA  Discharge Plan or Barriers: Pt plans to return home and follow up with outpatient.    Reason for Continuation of Hospitalization: Depression Medication stabilization Suicidal ideation Withdrawal symptoms  Comments:Mrs. Kliebert feels slightly better today. Her mood is improving, affect is brighter. She still has passing thoughts of suicide but is able to contract for safety on the unit. She slept all day yesterday possibly from medications but also coming off cocaine binge. He has not been able to participate in classes as a period but plans to attend programming today. She has no somatic symptoms. She feels that she slept better with no nightmares. She did not have any flashbacks yesterday since she was started on Minipress. She feels less anxious but still has been scratching her skin and pulling her eyebrows.  Estimated length of stay: Pt will likely discharge today.   New goal(s) NA  Review of initial/current patient goals per problem list:   1.  Goal(s): Patient will participate in aftercare plan * Met: Yes * Target date: at discharge * As evidenced by: Patient will participate within aftercare plan AEB aftercare provider and housing plan at discharge being identified.   2.  Goal (s): Patient will exhibit decreased depressive symptoms and suicidal ideations. * Met:  Yes *  Target date: at discharge * As evidenced by: Patient will utilize self rating of depression at 3 or below and demonstrate decreased signs of depression or be deemed stable for discharge by MD.   3.  Goal(s): Patient will demonstrate decreased signs and symptoms of anxiety. * Met: Yes * Target date: at discharge * As evidenced by: Patient will utilize self rating of anxiety at 3 or below and demonstrated decreased signs of anxiety, or be deemed stable for discharge by MD   4.  Goal(s): Patient will demonstrate decreased signs of withdrawal due to substance abuse * Met: Yes * Target date: at discharge * As evidenced by: Patient will produce a CIWA/COWS score of 0, have stable vitals signs, and no symptoms of withdrawal.   Attendees: Patient:  Laurie Stephens 3/16/201712:39 PM  Family:   3/16/201712:39 PM  Physician:   Dr. Bary Leriche  3/16/201712:39 PM  Nursing:   Sherrell Puller, RN  3/16/201712:39 PM  Case Manager:   3/16/201712:39 PM  Counselor:   3/16/201712:39 PM  Other:  [place] 3/16/201712:39 PM  Other:  Delle Reining, RN  3/16/201712:39 PM  Other:    3/16/201712:39 PM  Other:  3/16/201712:39 PM  Other:  3/16/201712:39 PM  Other:  3/16/201712:39 PM  Other:  3/16/201712:39 PM  Other:  3/16/201712:39 PM  Other:  3/16/201712:39 PM  Other:   3/16/201712:39 PM   Scribe for Treatment Team:   [place] MSW, [place] , 07/28/2015, 12:39 PM
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
## 2015-07-28 NOTE — Progress Notes (Signed)
  [facility] Discharge Plan :  Will you be returning to the same living situation after discharge:  Yes,  home At discharge, do you have transportation home?: Yes,  Friend  Do you have the ability to pay for your medications: Yes,  Insurance   Release of information consent forms completed and in the chart;  Patient's signature needed at discharge.  Patient to Follow up at: Follow-up Information    Follow up with [facility]. Go on 07/29/2015.   Why:  Hospital Follow up, Outpatient Medication Management, Therapy, Walk-ins Monday, Wednesday, Friday between 8am-3pm   Contact information:   [address] [place] [address] [phone] Fax:[phone]      Next level of care provider has access to [facility] Link:no  Safety Planning and Suicide Prevention discussed: Yes,  with patient   Have you used any form of tobacco in the last 30 days? (Cigarettes, Smokeless Tobacco, Cigars, and/or Pipes): Yes  Has patient been referred to the Quitline?: Patient refused referral  Patient has been referred for addiction treatment: Pt. refused referral  Laurie Stephens MSW, LCSWA  07/28/2015, 12:37 PM

## 2015-07-28 NOTE — Plan of Care (Signed)
Problem: Ineffective individual coping Goal: LTG: Patient will report a decrease in negative feelings Outcome: Progressing Patient reports that her depression is better today

## 2015-07-28 NOTE — [facility] Suicide Risk Assessment (Signed)
[facility]   Principal Problem: Major depressive disorder, recurrent severe without psychotic features [facility](HCC) Discharge Diagnoses:  Patient Active Problem List   Diagnosis Date Noted  . Major depressive disorder, recurrent severe without psychotic features (HCC) [F33.2] 07/26/2015  . 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 Suicidal ideation [R45.851] 07/24/2015  . OCD (obsessive compulsive disorder) [F42.9] 05/09/2015  . PTSD (post-traumatic stress disorder) [F43.10] 05/05/2015  . Adjustment disorder with mixed emotional features [F43.23] 05/05/2015  . Stimulant use disorder (HCC) (cocaine) [F15.90] 05/05/2015  . Tobacco use disorder [F17.200] 05/05/2015  . HTN (hypertension) [I10] 05/05/2015  . Asthma [J45.909] 05/05/2015  . Cannabis use disorder, moderate, dependence (HCC) [F12.20] 05/05/2015    Total Time spent with patient: 30 minutes  Musculoskeletal: Strength & Muscle Tone: within normal limits Gait & Station: normal Patient leans: N/A  Psychiatric Specialty Exam: Review of Systems  Skin: Positive for rash.  Psychiatric/Behavioral: The patient is nervous/anxious.   All other systems reviewed and are negative.   Blood pressure 135/69, pulse 70, temperature 98.2 F (36.8 C), temperature source Oral, resp. rate 20, height 5\' 3"  (1.6 m), weight 73.936 kg (163 lb), last menstrual period 07/22/2015, SpO2 99 %.Body mass index is 28.88 kg/(m^2).  General Appearance: Casual  Eye Contact::  Good  Speech:  Clear and Coherent409  Volume:  Normal  Mood:  Euthymic  Affect:  Appropriate  Thought Process:  Goal Directed  Orientation:  Full (Time, Place, and Person)  Thought Content:  WDL  Suicidal Thoughts:  No  Homicidal Thoughts:  No  Memory:  Immediate;   Fair Recent;   Fair Remote;   Fair  Judgement:  Impaired  Insight:  Present  Psychomotor Activity:  Normal  Concentration:  Fair  Recall:  FiservFair  Fund of Knowledge:Fair  Language: Fair   Akathisia:  No  Handed:  Right  AIMS (if indicated):     Assets:  Communication Skills Desire for Improvement Financial Resources/Insurance Housing Intimacy Physical Health Resilience Social Support  Sleep:  Number of Hours: 7  Cognition: WNL  ADL's:  Intact   Mental Status Per Nursing Assessment::   On Admission:     Demographic Factors:  Adolescent or young adult, Low socioeconomic status and Unemployed  Loss Factors: NA  Historical Factors: Prior suicide attempts, Family history of mental illness or substance abuse and Impulsivity  Risk Reduction Factors:   Responsible for children under 29 years of age, Sense of responsibility to family, Living with another person, especially a relative, Positive social support and Positive therapeutic relationship  Continued Clinical Symptoms:  Depression:   Comorbid alcohol abuse/dependence Impulsivity Alcohol/Substance Abuse/Dependencies Obsessive-Compulsive Disorder  Cognitive Features That Contribute To Risk:  None    Suicide Risk:  Minimal: No identifiable suicidal ideation.  Patients presenting with no risk factors but with morbid ruminations; may be classified as minimal risk based on the severity of the depressive symptoms  Follow-up Information    Follow up with [facility]. Go on 07/29/2015.   Why:  Hospital Follow up, Outpatient Medication Management, Therapy, Walk-ins Monday, Wednesday, Friday between 8am-3pm   Contact information:   [address] [place] [address] Phone:[phone] [phone]      Plan Of Care/Follow-up recommendations:  Activity:  As tolerated. Diet:  Regular. Other:  Keep follow-up appointments.  Kristine LineaJolanta Berdena Cisek, MD 07/28/2015, 12:16 PM

## 2015-07-28 NOTE — [facility] Group Notes (Signed)
[facility] Group Notes:  (Nursing/MHT/Case Management/Adjunct)  Date:  07/28/2015  Time:  2:13 PM  Type of Therapy:  Group Therapy  Participation Level:  Did Not Attend   Laurie Stephens De'Chelle Anavey Coombes 07/28/2015, 2:13 PM

## 2015-07-28 NOTE — [facility] Suicide Risk Assessment (Signed)
[facility] INPATIENT:  Family/Significant Other Suicide Prevention Education  Suicide Prevention Education:  Education Completed;Zach Dimas AguasHoward [phone] has been identified by the patient as the family member/significant other with whom the patient will be residing, and identified as the person(s) who will aid the patient in the event of a mental health crisis (suicidal ideations/suicide attempt).  With written consent from the patient, the family member/significant other has been provided the following suicide prevention education, prior to the and/or following the discharge of the patient.  The suicide prevention education provided includes the following:  Suicide risk factors  Suicide prevention and interventions  National Suicide Hotline telephone number  [facility] assessment telephone number  [facility] Emergency Assistance 911  [facility] and/or Residential Mobile Crisis Unit telephone number  Request made of family/significant other to:  Remove weapons (e.g., guns, rifles, knives), all items previously/currently identified as safety concern.    Remove drugs/medications (over-the-counter, prescriptions, illicit drugs), all items previously/currently identified as a safety concern.  The family member/significant other verbalizes understanding of the suicide prevention education information provided.  The family member/significant other agrees to remove the items of safety concern listed above.  Emmogene Simson L Gaige Fussner MSW, LCSWA  07/28/2015, 1:55 PM

## 2015-07-28 NOTE — Progress Notes (Signed)
Recreation Therapy Notes  Date: 03.16.17 Time: 3:00 pm Location: Craft Room  Group Topic: Leisure Education  Goal Area(s) Addresses:  Patient will identify activities for each letter of the alphabet. Patient will verbalize ability to integrate positive leisure into life post d/c. Patient will verbalize ability to use leisure as a Associate Professorcoping skill.  Behavioral Response: Attentive, Interactive  Intervention: Leisure Alphabet  Activity: Patients were given a Leisure Information systems managerAlphabet worksheet and instructed to list a healthy leisure activity for each letter of the alphabet.  Education: LRT educated patients on what is needed to participate in leisure.  Education Outcome: Acknowledges education/In group clarification offered  Clinical Observations/Feedback: Patient completed activity by listing healthy leisure activities. Patient contributed to group discussion by stating healthy leisure activities, what she needs to participate in leisure, how she can integrate leisure back into her schedule, and why leisure is a good Associate Professorcoping skill.  [name],Elaiza Shoberg M, LRT/CTRS 07/28/2015 4:23 PM

## 2015-07-28 NOTE — [facility] Group Notes (Signed)
[facility] Group Notes:  (Nursing/MHT/Case Management/Adjunct)  Date:  07/28/2015  Time:  9:29 AM  Type of Therapy:  Community Meeting   Participation Level:  Active  Participation Quality:  Attentive and Sharing  Affect:  Appropriate  Cognitive:  Alert, Appropriate and Oriented  Insight:  Appropriate  Engagement in Group:  Engaged  Modes of Intervention:  Discussion and Education  Summary of Progress/Problems:  Sparsh Callens De'Chelle Kellsie Grindle 07/28/2015, 9:29 AM

## 2015-07-28 NOTE — Progress Notes (Signed)
D:  Patient expresses readiness for discharge today.  Patient denies any pain currently.  Patient denies suicidal ideation, homicidal ideation, auditory or visual hallucinations currently. A:  Medications and instructions for their use were reviewed with the patient and she voiced understanding.  Discharge instructions and follow up were reviewed with the patient.  Patient's belongings were returned upon her leaving the unit. R:  Patient signed for the return of her belongings.  Patient was cooperative with the discharge process.  Patient expressed understanding of discharge instructions, follow up, medications and their use.  Patient was escorted off the unit.  Patient remains safe at the time of discharge.     

## 2015-07-29 NOTE — Progress Notes (Signed)
Recreation Therapy Notes  INPATIENT RECREATION TR PLAN  Patient Details Name: Cassanda Walmer MRN: [id number] DOB: 01/28/1986 Today's Date: 07/29/2015  Rec Therapy Plan Is patient appropriate for Therapeutic Recreation?: Yes Treatment times per week: At least once a week TR Treatment/Interventions: 1:1 session, Group participation (Comment) (Appropriate participation in daily recreation therapy tx)  Discharge Criteria Pt will be discharged from therapy if:: Treatment goals are met, Discharged Treatment plan/goals/alternatives discussed and agreed upon by:: Patient/family  Discharge Summary Short term goals set: See Care Plan Short term goals met: Complete Progress toward goals comments: One-to-one attended Which groups?: Leisure education One-to-one attended: Coping skills Reason goals not met: N/A Therapeutic equipment acquired: None Reason patient discharged from therapy: Discharge from hospital Pt/family agrees with progress & goals achieved: Yes Date patient discharged from therapy: 07/28/15   Leonette Monarch, LRT/CTRS 07/29/2015, 1:39 PM

## 2015-08-02 ENCOUNTER — Encounter (HOSPITAL_COMMUNITY): Payer: Self-pay

## 2015-08-02 ENCOUNTER — Emergency Department (HOSPITAL_COMMUNITY)
Admission: EM | Admit: 2015-08-02 | Discharge: 2015-08-02 | Payer: Medicaid Other | Attending: Emergency Medicine | Admitting: Emergency Medicine

## 2015-08-02 DIAGNOSIS — F329 Major depressive disorder, single episode, unspecified: Secondary | ICD-10-CM | POA: Diagnosis not present

## 2015-08-02 DIAGNOSIS — Z880 Allergy status to penicillin: Secondary | ICD-10-CM | POA: Diagnosis not present

## 2015-08-02 DIAGNOSIS — F17210 Nicotine dependence, cigarettes, uncomplicated: Secondary | ICD-10-CM | POA: Insufficient documentation

## 2015-08-02 DIAGNOSIS — F4310 Post-traumatic stress disorder, unspecified: Secondary | ICD-10-CM | POA: Insufficient documentation

## 2015-08-02 DIAGNOSIS — Z7951 Long term (current) use of inhaled steroids: Secondary | ICD-10-CM | POA: Insufficient documentation

## 2015-08-02 DIAGNOSIS — R45851 Suicidal ideations: Secondary | ICD-10-CM | POA: Diagnosis present

## 2015-08-02 DIAGNOSIS — J45909 Unspecified asthma, uncomplicated: Secondary | ICD-10-CM | POA: Diagnosis not present

## 2015-08-02 DIAGNOSIS — F32A Depression, unspecified: Secondary | ICD-10-CM

## 2015-08-02 DIAGNOSIS — I10 Essential (primary) hypertension: Secondary | ICD-10-CM | POA: Diagnosis not present

## 2015-08-02 DIAGNOSIS — T7421XA Adult sexual abuse, confirmed, initial encounter: Secondary | ICD-10-CM | POA: Insufficient documentation

## 2015-08-02 DIAGNOSIS — Z79899 Other long term (current) drug therapy: Secondary | ICD-10-CM | POA: Insufficient documentation
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
## 2015-08-02 NOTE — ED Notes (Signed)
MD at bedside at this time.
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
## 2015-08-02 NOTE — ED Notes (Signed)
Bed: WLPT2 Expected date:  Expected time:  Means of arrival:  Comments: Pt in room 
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
## 2016-03-31 ENCOUNTER — Emergency Department (HOSPITAL_COMMUNITY)
Admission: EM | Admit: 2016-03-31 | Discharge: 2016-04-02 | Disposition: A | Discharge status: Home / Self Care | Payer: Medicaid Other | Attending: Physician Assistant | Admitting: Physician Assistant

## 2016-03-31 ENCOUNTER — Encounter (HOSPITAL_COMMUNITY): Payer: Self-pay | Admitting: Emergency Medicine

## 2016-03-31 DIAGNOSIS — T50901A Poisoning by unspecified drugs, medicaments and biological substances, accidental (unintentional), initial encounter: Secondary | ICD-10-CM

## 2016-03-31 DIAGNOSIS — F17210 Nicotine dependence, cigarettes, uncomplicated: Secondary | ICD-10-CM | POA: Insufficient documentation

## 2016-03-31 DIAGNOSIS — F1120 Opioid dependence, uncomplicated: Secondary | ICD-10-CM

## 2016-03-31 DIAGNOSIS — Z79899 Other long term (current) drug therapy: Secondary | ICD-10-CM | POA: Insufficient documentation

## 2016-03-31 DIAGNOSIS — F1099 Alcohol use, unspecified with unspecified alcohol-induced disorder: Secondary | ICD-10-CM | POA: Diagnosis not present

## 2016-03-31 DIAGNOSIS — J45909 Unspecified asthma, uncomplicated: Secondary | ICD-10-CM | POA: Diagnosis not present

## 2016-03-31 DIAGNOSIS — I10 Essential (primary) hypertension: Secondary | ICD-10-CM | POA: Diagnosis not present

## 2016-03-31 DIAGNOSIS — T401X1A Poisoning by heroin, accidental (unintentional), initial encounter: Secondary | ICD-10-CM | POA: Insufficient documentation

## 2016-03-31 DIAGNOSIS — L538 Other specified erythematous conditions: Secondary | ICD-10-CM | POA: Insufficient documentation

## 2016-03-31 LAB — BASIC METABOLIC PANEL
Anion gap: 12 (ref 5–15)
BUN: 16 mg/dL (ref 6–20)
CALCIUM: 9.1 mg/dL (ref 8.9–10.3)
CHLORIDE: 101 mmol/L (ref 101–111)
CO2: 25 mmol/L (ref 22–32)
CREATININE: 0.93 mg/dL (ref 0.44–1.00)
GFR calc Af Amer: >60 mL/min (ref >60)
GFR calc non Af Amer: >60 mL/min (ref >60)
GLUCOSE: 96 mg/dL (ref 65–99)
Potassium: 3.5 mmol/L (ref 3.5–5.1)
Sodium: 138 mmol/L (ref 135–145)

## 2016-03-31 LAB — URINALYSIS, ROUTINE W REFLEX MICROSCOPIC
BILIRUBIN URINE: NEGATIVE
GLUCOSE, UA: NEGATIVE mg/dL
Ketones, ur: 15 mg/dL — AB
Leukocytes, UA: NEGATIVE
Nitrite: NEGATIVE
PROTEIN: NEGATIVE mg/dL
Specific Gravity, Urine: 1.023 (ref 1.005–1.030)
pH: 6 (ref 5.0–8.0)

## 2016-03-31 LAB — CBC
HCT: 40.4 % (ref 36.0–46.0)
Hemoglobin: 12.8 g/dL (ref 12.0–15.0)
MCH: 27.1 pg (ref 26.0–34.0)
MCHC: 31.7 g/dL (ref 30.0–36.0)
MCV: 85.4 fL (ref 78.0–100.0)
PLATELETS: 489 10*3/uL — AB (ref 150–400)
RBC: 4.73 MIL/uL (ref 3.87–5.11)
RDW: 14.2 % (ref 11.5–15.5)
WBC: 13.7 10*3/uL — ABNORMAL HIGH (ref 4.0–10.5)

## 2016-03-31 LAB — RAPID URINE DRUG SCREEN, HOSP PERFORMED
Amphetamines: POSITIVE — AB
BENZODIAZEPINES: NOT DETECTED
Barbiturates: NOT DETECTED
COCAINE: POSITIVE — AB
Opiates: POSITIVE — AB
TETRAHYDROCANNABINOL: NOT DETECTED

## 2016-03-31 LAB — CBG MONITORING, ED
GLUCOSE-CAPILLARY: 87 mg/dL (ref 65–99)
GLUCOSE-CAPILLARY: 166 mg/dL — AB (ref 65–99)

## 2016-03-31 LAB — HEPATIC FUNCTION PANEL
ALBUMIN: 4 g/dL (ref 3.5–5.0)
ALK PHOS: 74 U/L (ref 38–126)
ALT: 36 U/L (ref 14–54)
AST: 27 U/L (ref 15–41)
BILIRUBIN TOTAL: 0.4 mg/dL (ref 0.3–1.2)
Bilirubin, Direct: 0.3 mg/dL (ref 0.1–0.5)
Indirect Bilirubin: 0.1 mg/dL — ABNORMAL LOW (ref 0.3–0.9)
TOTAL PROTEIN: 7.6 g/dL (ref 6.5–8.1)

## 2016-03-31 LAB — I-STAT CG4 LACTIC ACID, ED
LACTIC ACID, VENOUS: 1.16 mmol/L (ref 0.5–1.9)
Lactic Acid, Venous: 0.87 mmol/L (ref 0.5–1.9)

## 2016-03-31 LAB — URINE MICROSCOPIC-ADD ON: BACTERIA UA: NONE SEEN

## 2016-03-31 LAB — ETHANOL

## 2016-03-31 LAB — POC URINE PREG, ED: Preg Test, Ur: NEGATIVE

## 2016-03-31 MED ORDER — DOXYCYCLINE HYCLATE 100 MG PO TABS
100.0000 mg | ORAL_TABLET | Freq: Once | ORAL | Status: AC
  Administered 2016-03-31: 100 mg via ORAL
  Filled 2016-03-31: qty 1

## 2016-03-31 MED ORDER — SODIUM CHLORIDE 0.9 % IV BOLUS (SEPSIS)
500.0000 mL | Freq: Once | INTRAVENOUS | Status: AC
  Administered 2016-03-31: 500 mL via INTRAVENOUS

## 2016-03-31 MED ORDER — IBUPROFEN 400 MG PO TABS: 600.0000 mg | ORAL_TABLET | Freq: Three times a day (TID) | ORAL | Status: DC | PRN

## 2016-03-31 MED ORDER — DOXYCYCLINE HYCLATE 100 MG PO CAPS: 100.0000 mg | ORAL_CAPSULE | Freq: Two times a day (BID) | ORAL | 0 refills | Status: DC

## 2016-03-31 MED ORDER — NALOXONE HCL 0.4 MG/ML IJ SOLN
INTRAMUSCULAR | Status: AC
  Administered 2016-03-31: 15:00:00
  Filled 2016-03-31: qty 1

## 2016-03-31 MED ORDER — ONDANSETRON HCL 4 MG PO TABS: 4.0000 mg | ORAL_TABLET | Freq: Three times a day (TID) | ORAL | Status: DC | PRN

## 2016-03-31 MED ORDER — LORAZEPAM 1 MG PO TABS: 1.0000 mg | ORAL_TABLET | Freq: Three times a day (TID) | ORAL | Status: DC | PRN

## 2016-03-31 MED ORDER — NALOXONE HCL 2 MG/2ML IJ SOSY
PREFILLED_SYRINGE | INTRAMUSCULAR | Status: AC
  Administered 2016-03-31: 15:00:00
  Filled 2016-03-31: qty 2

## 2016-03-31 MED ORDER — LORAZEPAM 1 MG PO TABS
1.0000 mg | ORAL_TABLET | ORAL | Status: DC | PRN
  Administered 2016-04-01 – 2016-04-02 (×4): 1 mg via ORAL
  Filled 2016-03-31 (×4): qty 1

## 2016-03-31 MED ORDER — DOXYCYCLINE HYCLATE 100 MG PO TABS
100.0000 mg | ORAL_TABLET | Freq: Two times a day (BID) | ORAL | Status: DC
  Administered 2016-03-31 – 2016-04-02 (×4): 100 mg via ORAL
  Filled 2016-03-31 (×4): qty 1

## 2016-03-31 MED ORDER — ONDANSETRON 4 MG PO TBDP
8.0000 mg | ORAL_TABLET | Freq: Once | ORAL | Status: AC
  Administered 2016-03-31: 8 mg via ORAL
  Filled 2016-03-31: qty 2

## 2016-03-31 MED ORDER — ACETAMINOPHEN 325 MG PO TABS: 650.0000 mg | ORAL_TABLET | ORAL | Status: DC | PRN
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
## 2016-03-31 NOTE — ED Notes (Signed)
Pt asking for her makeup and purse explained to pt. That as per policy we refrain from giving the pt. These items to ensure safety.  Pt remains in room with sitter at bedside

## 2016-03-31 NOTE — ED Notes (Signed)
Pt still in the bathroom attached to her room. She was undressed. She stated "I have just been trying to give myself a bird bath". This RN informed the pt that she had been given her discharge paperwork over 30 minutes ago and that we were waiting for her to finish up. The pt verbalized understanding and said that she would try and finish up.

## 2016-03-31 NOTE — ED Notes (Signed)
Dr Ray at bedside. 

## 2016-03-31 NOTE — ED Notes (Signed)
Wrote name on board and introduced myself to pt.  Pt was sitting up in bed and applying make-up.  Pt was very pleasant.  Advised her to let her nurse or I know if she needed anything. Call light within reach.

## 2016-03-31 NOTE — ED Notes (Signed)
Pt being assisted with breathing via BVM by Thayer Ohmhris NT.

## 2016-03-31 NOTE — ED Notes (Signed)
Kathlene NovemberMike, Security removed all drug paraphernalia from the bathroom including both of the needles with orange caps, the brown substance in the bottle cap, the mirror with the brown substance, and the tourniquet. All items taken to security.
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
## 2016-03-31 NOTE — ED Notes (Signed)
Pt sleeping. 

## 2016-03-31 NOTE — ED Notes (Addendum)
Was pulled into room by Steward DroneBrenda, RN advising pt was on floor in bathroom not breathing.  I found pt laying left lateral; cryonic and agonal.  Steward DroneBrenda, RN went to get EDP and myself and Hannie, EMT rolled pt on her back.  I began to ventilate pt with BVM o2 on 15lmp.  Pt was placed on sheet by Dr. Rosalia Hammersay, Thayer Ohmhris EMT, [facility] EMT and pulled into room beside bed and placed on bed. Lynnze, RN was in room and assisting getting thing set up for IV Narcan.  Once pt was in bed had positive color change with ventilation.  Pt placed on monitor and IV was established by nursing staff.  Paramedic student and [place] Paramedic Clydie BraunCaptain was also in room assisting with pt care.

## 2016-03-31 NOTE — ED Notes (Signed)
I attempted several times to take temp orally wouldn't register.  Axillary would register at 93.5 passed this information on to oncoming EMT. Lab is present at bedside at the moment.

## 2016-03-31 NOTE — Discharge Instructions (Addendum)
Follow up for out pt tx °

## 2016-03-31 NOTE — ED Notes (Signed)
[employer] officer Aundria RudWilkinson at bedside
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
## 2016-03-31 NOTE — BHH Counselor (Signed)
Pt has been faxed to the following inpt hospitals for possible inpt admission:  [place]; [facility]; [facility]; [place]; [place]; [address]; [place]; [place]; [place]; [place]

## 2016-03-31 NOTE — ED Notes (Signed)
While talking to MD, Pt states "I would run into a wall just to end it if I could"

## 2016-03-31 NOTE — ED Notes (Signed)
Benjamine MolaJamie Chare RN at bedside with security.

## 2016-03-31 NOTE — ED Notes (Signed)
ER MD at bedside with pt. Pt to be admitted

## 2016-03-31 NOTE — ED Notes (Signed)
Report given to relieving sitter; sitter at bedside

## 2016-03-31 NOTE — ED Notes (Signed)
Pt talking to TTS 

## 2016-03-31 NOTE — ED Notes (Signed)
This RN called phlebotomy requesting assistance with obtaining blood cultures from the pt.

## 2016-03-31 NOTE — ED Notes (Signed)
Pricilla Holmucker PA aware of the pts temperatures
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
## 2016-03-31 NOTE — ED Notes (Signed)
Pt ambulated to restroom attached to room.  

## 2016-03-31 NOTE — ED Notes (Signed)
Ambulance was arriving with new pt for A9.  I had been notified that this pt was in bathroom giving herself a "birdbath" and that Constellation BrandsLynnze RN had notified pt she needed to leave.  Upon entering room with EMT Harley, I found the bathroom was dark and found pt laying, unresponsive and naked on bathroom floor, breathing agonally, with needles at bedside.

## 2016-03-31 NOTE — ED Triage Notes (Signed)
Patient from parking lot at [employer].  Patient told EMS that she started having nausea about 8 hours ago.  She had a syncopal episode per patient, but was found asleep.  She did heroin sometime last night and has admitted to using meth.  Patient has old meth wounds on face.  She states she is nauseated and tired.  She is homeless and has not been able to go to a shelter.

## 2016-03-31 NOTE — ED Notes (Signed)
Pt arrived in room

## 2016-03-31 NOTE — ED Notes (Signed)
Pt requested to have the bair hugger removed.
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
## 2016-03-31 NOTE — ED Notes (Signed)
Entered in to pt's room for assist BVM being administered ER MD in BR with CNA assisting ventilating pt. Sheet placed under sheet and pt. Placed back in to stretcher.  Pt remains unresponsive with bluish color to skin.  IV established. ER MD in room with primary Rn.  IV Narcan at 1440 vis push. 1442 pt being to wake up appears drowsy primary RN with ER MD and CNA remain in room.  This RN left the room

## 2016-03-31 NOTE — ED Notes (Signed)
CBG 87. 

## 2016-03-31 NOTE — ED Notes (Signed)
Pt found unresponsive on the floor of the bathroom by Lajuana RippleBrenda RN, two needles found at the pts side. Dr. Rosalia Hammersay, security, and Sherlon HandingJamie Charge RN notified.

## 2016-03-31 NOTE — ED Notes (Addendum)
Pt removed jewelry (2 rings, 1 pair hoop earrings, 3 necklaces); this tech placed jewelry in specimen cup with pt label and gave cup to RN to put with pt's belongings; pt calm, cooperative, eating Malaw[place]key sandwich

## 2016-03-31 NOTE — ED Notes (Signed)
Two needles with orange caps found on the floor of the restroom. Brown substance in a bottle cap found on the sink, brown substance also found on a compact mirror on the sink, blue tourniquet found in the sink.

## 2016-03-31 NOTE — ED Notes (Addendum)
Patient requested this nurse to call the father of her children Cach at [phone]  No answer at this time

## 2016-03-31 NOTE — ED Notes (Signed)
Pt given ginger ale, crackers, and peanut butter.

## 2016-03-31 NOTE — ED Notes (Signed)
Pt c/o nausea feeling ER MD made aware orders received and carried out
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
## 2016-03-31 NOTE — ED Notes (Signed)
Dr. Rosalia Hammersay, Steward DroneBrenda RN, Jon GillsAlexis RN, Gladstone LighterAlecia RN, BourbonHannie NT, and Leland Grovehris NT at bedside

## 2016-04-01 MED ORDER — POLYETHYLENE GLYCOL 3350 17 G PO PACK
17.0000 g | PACK | Freq: Every day | ORAL | Status: DC
  Administered 2016-04-01 – 2016-04-02 (×2): 17 g via ORAL
  Filled 2016-04-01 (×2): qty 1

## 2016-04-01 NOTE — ED Notes (Signed)
Pt sitting up on bed eating rest of her lunch. Pt asked for peanut butter - advised not snack time yet. Voiced understanding.

## 2016-04-01 NOTE — ED Notes (Signed)
Pt given juice by staff as requested.

## 2016-04-01 NOTE — ED Notes (Signed)
Pt sleeping at this time.

## 2016-04-01 NOTE — ED Notes (Signed)
Pt experiencing difficulty w/staying awake during conversation.

## 2016-04-01 NOTE — ED Notes (Signed)
Sitting up on bed watching tv.

## 2016-04-01 NOTE — ED Notes (Signed)
Pt gave nurse a letter and asked her to call her family and read it to them. Pts. family was called and was read the .

## 2016-04-01 NOTE — ED Notes (Signed)
Pt given Caff-Free Coke as requested for snack and crackers.

## 2016-04-01 NOTE — ED Notes (Signed)
[facility], copy sent [facility] records, original placed in folder for magistrate - all 3 copies brought in by [employer] placed on clipboard.

## 2016-04-01 NOTE — ED Notes (Signed)
Vitals completed by current sitter with Pt.

## 2016-04-01 NOTE — ED Notes (Signed)
Pt had removed her scrubs d/t told sitter she was hot then asked for warm blanket d/t cold. Refused to put scrubs back on as sitter had requested. RN advised pt to put scrubs back. Pt put her scrubs back on then returned to sleeping.

## 2016-04-01 NOTE — ED Notes (Signed)
Pt writing letter of info she wants RN to call and notify her boyfriend w/.

## 2016-04-01 NOTE — ED Notes (Signed)
Pt's friend, Zachery DauerBarnes, called - advised pt sleeping and will advise her of his phone call when pt awakens.

## 2016-04-01 NOTE — ED Notes (Signed)
Pt signed Medical Clearance Pt Policy form - verbalized understanding. Copy given.

## 2016-04-01 NOTE — ED Notes (Signed)
Snack given.

## 2016-04-02 DIAGNOSIS — F1099 Alcohol use, unspecified with unspecified alcohol-induced disorder: Secondary | ICD-10-CM

## 2016-04-02 DIAGNOSIS — F1120 Opioid dependence, uncomplicated: Secondary | ICD-10-CM

## 2016-04-02 DIAGNOSIS — Z888 Allergy status to other drugs, medicaments and biological substances status: Secondary | ICD-10-CM

## 2016-04-02 DIAGNOSIS — Z880 Allergy status to penicillin: Secondary | ICD-10-CM

## 2016-04-02 DIAGNOSIS — T401X1A Poisoning by heroin, accidental (unintentional), initial encounter: Secondary | ICD-10-CM | POA: Insufficient documentation

## 2016-04-02 DIAGNOSIS — F17210 Nicotine dependence, cigarettes, uncomplicated: Secondary | ICD-10-CM | POA: Diagnosis not present

## 2016-04-02 DIAGNOSIS — Z79899 Other long term (current) drug therapy: Secondary | ICD-10-CM

## 2016-04-02 MED ORDER — NICOTINE 21 MG/24HR TD PT24
21.0000 mg | MEDICATED_PATCH | Freq: Every day | TRANSDERMAL | Status: DC
  Administered 2016-04-02: 21 mg via TRANSDERMAL
  Filled 2016-04-02: qty 1

## 2016-04-02 NOTE — ED Notes (Signed)
Patient refused snack.  

## 2016-04-02 NOTE — ED Notes (Signed)
Patient given coffee and apple juice.

## 2016-04-02 NOTE — ED Notes (Signed)
Patient given Cheree DittoGraham crackers and Peanut butter.
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
## 2016-04-02 NOTE — ED Notes (Signed)
Patient tearful during phone call with family.

## 2016-04-05 LAB — CULTURE, BLOOD (ROUTINE X 2): Culture: NO GROWTH

## 2016-09-26 ENCOUNTER — Emergency Department (HOSPITAL_COMMUNITY)
Admission: EM | Admit: 2016-09-26 | Discharge: 2016-09-26 | Disposition: A | Discharge status: Home / Self Care | Payer: Medicaid Other | Attending: Emergency Medicine | Admitting: Emergency Medicine

## 2016-09-26 ENCOUNTER — Encounter (HOSPITAL_COMMUNITY): Payer: Self-pay

## 2016-09-26 ENCOUNTER — Emergency Department (HOSPITAL_COMMUNITY): Payer: Medicaid Other

## 2016-09-26 DIAGNOSIS — F1910 Other psychoactive substance abuse, uncomplicated: Secondary | ICD-10-CM | POA: Insufficient documentation

## 2016-09-26 DIAGNOSIS — F17210 Nicotine dependence, cigarettes, uncomplicated: Secondary | ICD-10-CM | POA: Insufficient documentation

## 2016-09-26 DIAGNOSIS — I10 Essential (primary) hypertension: Secondary | ICD-10-CM | POA: Insufficient documentation

## 2016-09-26 DIAGNOSIS — T401X4A Poisoning by heroin, undetermined, initial encounter: Secondary | ICD-10-CM | POA: Insufficient documentation

## 2016-09-26 DIAGNOSIS — J45909 Unspecified asthma, uncomplicated: Secondary | ICD-10-CM | POA: Diagnosis not present

## 2016-09-26 DIAGNOSIS — Z79899 Other long term (current) drug therapy: Secondary | ICD-10-CM | POA: Insufficient documentation

## 2016-09-26 LAB — CBC WITH DIFFERENTIAL/PLATELET
BASOS ABS: 0 10*3/uL (ref 0.0–0.1)
Basophils Relative: 1 %
EOS ABS: 0.1 10*3/uL (ref 0.0–0.7)
EOS PCT: 1 %
HCT: 37.8 % (ref 36.0–46.0)
Hemoglobin: 12 g/dL (ref 12.0–15.0)
Lymphocytes Relative: 15 %
Lymphs Abs: 1.3 10*3/uL (ref 0.7–4.0)
MCH: 28.6 pg (ref 26.0–34.0)
MCHC: 31.7 g/dL (ref 30.0–36.0)
MCV: 90 fL (ref 78.0–100.0)
Monocytes Absolute: 0.6 10*3/uL (ref 0.1–1.0)
Monocytes Relative: 7 %
Neutro Abs: 6.3 10*3/uL (ref 1.7–7.7)
Neutrophils Relative %: 76 %
PLATELETS: 414 10*3/uL — AB (ref 150–400)
RBC: 4.2 MIL/uL (ref 3.87–5.11)
RDW: 13.7 % (ref 11.5–15.5)
WBC: 8.3 10*3/uL (ref 4.0–10.5)

## 2016-09-26 LAB — COMPREHENSIVE METABOLIC PANEL
ALT: 25 U/L (ref 14–54)
AST: 24 U/L (ref 15–41)
Albumin: 3.6 g/dL (ref 3.5–5.0)
Alkaline Phosphatase: 69 U/L (ref 38–126)
Anion gap: 11 (ref 5–15)
BUN: 19 mg/dL (ref 6–20)
CO2: 25 mmol/L (ref 22–32)
Calcium: 9.2 mg/dL (ref 8.9–10.3)
Chloride: 101 mmol/L (ref 101–111)
Creatinine, Ser: 0.84 mg/dL (ref 0.44–1.00)
GFR calc Af Amer: >60 mL/min (ref >60)
GFR calc non Af Amer: >60 mL/min (ref >60)
Glucose, Bld: 121 mg/dL — ABNORMAL HIGH (ref 65–99)
Potassium: 3.5 mmol/L (ref 3.5–5.1)
Sodium: 137 mmol/L (ref 135–145)
Total Bilirubin: 1.2 mg/dL (ref 0.3–1.2)
Total Protein: 8.1 g/dL (ref 6.5–8.1)

## 2016-09-26 LAB — URINALYSIS, ROUTINE W REFLEX MICROSCOPIC
Bilirubin Urine: NEGATIVE
GLUCOSE, UA: NEGATIVE mg/dL
HGB URINE DIPSTICK: NEGATIVE
KETONES UR: NEGATIVE mg/dL
Nitrite: NEGATIVE
PROTEIN: 30 mg/dL — AB
Specific Gravity, Urine: 1.027 (ref 1.005–1.030)
pH: 5 (ref 5.0–8.0)

## 2016-09-26 LAB — RAPID URINE DRUG SCREEN, HOSP PERFORMED
Amphetamines: POSITIVE — AB
Barbiturates: NOT DETECTED
Benzodiazepines: NOT DETECTED
Cocaine: POSITIVE — AB
Opiates: POSITIVE — AB
Tetrahydrocannabinol: POSITIVE — AB

## 2016-09-26 LAB — I-STAT CG4 LACTIC ACID, ED: LACTIC ACID, VENOUS: 0.56 mmol/L (ref 0.5–1.9)

## 2016-09-26 LAB — CBG MONITORING, ED: Glucose-Capillary: 140 mg/dL — ABNORMAL HIGH (ref 65–99)

## 2016-09-26 MED ORDER — DOXYCYCLINE HYCLATE 100 MG PO CAPS: 100.0000 mg | ORAL_CAPSULE | Freq: Two times a day (BID) | ORAL | 0 refills | Status: AC

## 2016-09-26 MED ORDER — CLINDAMYCIN HCL 150 MG PO CAPS: 450.0000 mg | ORAL_CAPSULE | Freq: Three times a day (TID) | ORAL | 0 refills | Status: DC

## 2016-09-26 NOTE — ED Notes (Signed)
Patient very dirty, incontinent of urine. Patient tearful at times. Patient states she is ashamed of her drug use. Patient stated that she probably should not have come here and mentioned that she might leave.
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
## 2016-09-26 NOTE — ED Triage Notes (Signed)
Pt told EMS that she didn't have anything to live for

## 2016-09-26 NOTE — ED Triage Notes (Signed)
Pt states she has been very depressed
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
## 2016-09-26 NOTE — ED Triage Notes (Signed)
Pt has an abscess on her right ankle from needles  Pt is homeless right now and her would on her ankle is infected looking and covered with a dirty bandage Pt states she [facility] but hasn't heard yet Pt was not given narcan by EMS and is falling asleep in triage

## 2016-09-26 NOTE — ED Notes (Signed)
Rn starting IV

## 2016-09-26 NOTE — ED Notes (Signed)
Bed: [facility] Expected date:  Expected time:  Means of arrival:  Comments: 

## 2016-09-26 NOTE — ED Notes (Signed)
Attempted to go in patient room and do EKG patient stated she was told by PA that she could go to the bathroom and wash off her feet before anything is to be done. RN notified of delay.

## 2016-09-26 NOTE — Discharge Instructions (Signed)
Your blood work, urine and x-rays were overall reassuring.   Please take antibiotic for your ankle wound. Monitor for signs of worsening infection including increased redness, swelling, pain and discharge.  Please follow up on your rehab placement, I hope you're able to find the necessary resources.

## 2016-11-11 DEATH — deceased
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
